# Patient Record
Sex: Female | Born: 1990 | Race: White | Hispanic: No | Marital: Single | Smoking: Former smoker
Health system: Southern US, Community
[De-identification: ages and names within clinical notes are randomized; demographics above are authoritative.]

## PROBLEM LIST (undated history)

## (undated) DIAGNOSIS — S62309A Unspecified fracture of unspecified metacarpal bone, initial encounter for closed fracture: Secondary | ICD-10-CM

## (undated) DIAGNOSIS — Z87442 Personal history of urinary calculi: Secondary | ICD-10-CM

## (undated) DIAGNOSIS — Z862 Personal history of diseases of the blood and blood-forming organs and certain disorders involving the immune mechanism: Secondary | ICD-10-CM

## (undated) HISTORY — PX: NO PAST SURGERIES: SHX2092

---
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
## 2008-01-14 ENCOUNTER — Emergency Department (HOSPITAL_COMMUNITY): Admission: EM | Admit: 2008-01-14 | Discharge: 2008-01-14 | Payer: Self-pay | Admitting: Emergency Medicine

## 2008-01-14 ENCOUNTER — Inpatient Hospital Stay (HOSPITAL_COMMUNITY): Admission: EM | Admit: 2008-01-14 | Discharge: 2008-01-19 | Payer: Self-pay | Admitting: Urology

## 2010-07-07 ENCOUNTER — Emergency Department (HOSPITAL_COMMUNITY): Payer: Self-pay

## 2010-07-07 ENCOUNTER — Emergency Department (HOSPITAL_COMMUNITY)
Admission: EM | Admit: 2010-07-07 | Discharge: 2010-07-07 | Disposition: A | Discharge status: Home / Self Care | Payer: Self-pay | Attending: Emergency Medicine | Admitting: Emergency Medicine

## 2010-07-07 DIAGNOSIS — I889 Nonspecific lymphadenitis, unspecified: Secondary | ICD-10-CM | POA: Insufficient documentation

## 2010-07-07 DIAGNOSIS — R197 Diarrhea, unspecified: Secondary | ICD-10-CM | POA: Insufficient documentation

## 2010-07-07 DIAGNOSIS — Z87442 Personal history of urinary calculi: Secondary | ICD-10-CM | POA: Insufficient documentation

## 2010-07-07 DIAGNOSIS — R109 Unspecified abdominal pain: Secondary | ICD-10-CM | POA: Insufficient documentation

## 2010-07-07 DIAGNOSIS — R112 Nausea with vomiting, unspecified: Secondary | ICD-10-CM | POA: Insufficient documentation

## 2010-07-07 LAB — URINALYSIS, ROUTINE W REFLEX MICROSCOPIC
Ketones, ur: >80 mg/dL — AB
Protein, ur: NEGATIVE mg/dL
Urine Glucose, Fasting: NEGATIVE mg/dL
Urobilinogen, UA: 0.2 mg/dL (ref 0.0–1.0)

## 2010-07-07 LAB — HEPATIC FUNCTION PANEL
ALT: 17 U/L (ref 0–35)
Alkaline Phosphatase: 79 U/L (ref 39–117)
Bilirubin, Direct: 0.2 mg/dL (ref 0.0–0.3)
Indirect Bilirubin: 0.9 mg/dL (ref 0.3–0.9)

## 2010-07-07 LAB — DIFFERENTIAL
Basophils Relative: 0 % (ref 0–1)
Eosinophils Absolute: 0 10*3/uL (ref 0.0–0.7)
Eosinophils Relative: 0 % (ref 0–5)
Lymphs Abs: 0.8 10*3/uL (ref 0.7–4.0)
Monocytes Absolute: 0.7 10*3/uL (ref 0.1–1.0)
Monocytes Relative: 5 % (ref 3–12)
Neutrophils Relative %: 90 % — ABNORMAL HIGH (ref 43–77)

## 2010-07-07 LAB — CBC
MCH: 30.6 pg (ref 26.0–34.0)
MCHC: 36.7 g/dL — ABNORMAL HIGH (ref 30.0–36.0)
MCV: 83.4 fL (ref 78.0–100.0)
Platelets: 202 10*3/uL (ref 150–400)
RBC: 4.64 MIL/uL (ref 3.87–5.11)

## 2010-07-07 LAB — POCT I-STAT, CHEM 8
BUN: 17 mg/dL (ref 6–23)
Calcium, Ion: 1.04 mmol/L — ABNORMAL LOW (ref 1.12–1.32)
Chloride: 106 mEq/L (ref 96–112)
Creatinine, Ser: 0.8 mg/dL (ref 0.4–1.2)
Glucose, Bld: 122 mg/dL — ABNORMAL HIGH (ref 70–99)
TCO2: 20 mmol/L (ref 0–100)

## 2010-07-07 LAB — LIPASE, BLOOD: Lipase: 18 U/L (ref 11–59)

## 2010-07-07 MED ORDER — IOHEXOL 300 MG/ML  SOLN
100.0000 mL | Freq: Once | INTRAMUSCULAR | Status: AC | PRN
  Administered 2010-07-07: 100 mL via INTRAVENOUS

## 2010-10-15 NOTE — Discharge Summary (Signed)
NAMELILLYIAN, HEIDT                ACCOUNT NO.:  [id number]   MEDICAL RECORD NO.:  [id number]          PATIENT TYPE:  INP   LOCATION:  1444                         FACILITY:  [facility]   PHYSICIAN:  Courtney Paris, M.D.DATE OF BIRTH:  [birth year]   DATE OF ADMISSION:  01/14/2008  DATE OF DISCHARGE:  01/19/2008                               DISCHARGE SUMMARY   ADDENDUM:  This 20 year old white female was admitted with left  hydronephrosis and pyelonephritis and probable recent kidney stone  passage.  She had a 103 fever.  I had originally discharged her  yesterday, but after I left, she began vomiting and unable to keep  anything down.  I cancelled the discharge, kept her another day.  She  has not vomited or been nauseated for over 24 hours.  She has been able  to eat and has remained afebrile.  Will send her home on Cipro as  originally planned.      Courtney Paris, M.D.  Electronically Signed     HMK/MEDQ  D:  01/19/2008  T:  01/19/2008  Job:  [id number]

## 2010-10-15 NOTE — H&P (Signed)
Rebecca Crosby, Rebecca Crosby                ACCOUNT NO.:  [id number]   MEDICAL RECORD NO.:  [id number]          PATIENT TYPE:  INP   LOCATION:  1444                         FACILITY:  [facility]   PHYSICIAN:  Ronald L. Earlene Plater, M.D.  DATE OF BIRTH:  [birth year]   DATE OF ADMISSION:  01/14/2008  DATE OF DISCHARGE:                              HISTORY & PHYSICAL   CHIEF COMPLAINT:  I feel terrible.   HISTORY OF PRESENT ILLNESS:  Rebecca Crosby is a very nice 20 year old white  female who presents with left flank pain, fever, chills, nausea.  She  has a known history of kidney stones and has passed multiple stones over  the last few years.  She has undergone workup by Dr. 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  She developed  left flank pain and nausea and was seen in the emergency room today.  CT  scan by report revealed a probable passed left ureteral calculus and  infected urine.  She was given Rocephin, and urine was cultured, and  that is all pending.  She went home and developed fever to 103.1 degrees  Fahrenheit, sweats and chills.  She is admitted for IV fluids,  antibiotics and observation.   PAST MEDICAL HISTORY:  She has no known allergies.   MEDICATIONS:  She is on Phenergan, Vicodin and Azo standard.   ILLNESSES:  None significant except kidney stones.   SOCIAL HISTORY:  Negative smoker, negative drinker.   FAMILY HISTORY:  Nonsignificant.   REVIEW OF SYSTEMS:  She has sweats and chills, urgency, frequency and  some dysuria and flank pain.  No shortness of breath, dyspnea on  exertion, chest pain or GI complaints.   EXAMINATION:  Temperature 103 degrees Fahrenheit.  Vital signs otherwise  stable.  GENERAL:  She is well-nourished, well-developed, well-groomed in mild  distress oriented x3.  HEENT:  Normal.  NECK:  Without mass or thyromegaly.  CHEST:  Has normal diaphragmatic motion.  ABDOMEN:  Soft, nontender without masses, organomegaly or hernia.   She  does have some left CVA and slight suprapubic tenderness.  EXTREMITIES:  Normal.  NEUROLOGICAL:  Intact.  SKIN:  Normal.   IMPRESSION:  Probable past left ureteral calculus with a pyelonephritis  and possible urosepsis.   PLAN:  Admit.  Blood cultures.  IV fluids and IV Cipro.  We will observe  her course.      Ronald L. Earlene Plater, M.D.  Electronically Signed     RLD/MEDQ  D:  01/14/2008  T:  01/15/2008  Job:  [id number]

## 2010-10-15 NOTE — Discharge Summary (Signed)
[name], Rebecca Crosby                ACCOUNT NO.:  [id number]   MEDICAL RECORD NO.:  [id number]          PATIENT TYPE:  INP   LOCATION:  1444                         FACILITY:  [facility]   PHYSICIAN:  Courtney Paris, M.D.DATE OF BIRTH:  [birth year]   DATE OF ADMISSION:  01/14/2008  DATE OF DISCHARGE:  01/18/2008                               DISCHARGE SUMMARY   DISCHARGE DIAGNOSES:  1. Left pyelonephritis.  2. Possible recent passage of kidney stone, no operations or      procedures   BRIEF HISTORY:  This 20 year old white female was admitted as an  emergency on January 14, 2008, by Dr. Earlene Plater.  She has a known history of  kidney stones.  She had left flank pain, fever, chills and nausea and  some vomiting.  She passed multiple stones over the past few years.  She  has undergone extensive metabolic workup in the past.  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  She developed left flank pain  and nausea and was seen in the emergency room on the day of admission.  CT showed some periureteral stranding, a possible past left ureteral  calculus and infected urine.  She was given Rocephin, urine cultured and  she went home.  She developed a fever of 103, came back and was admitted  for IV antibiotics and observation.  She has had no other illnesses  other than kidney stones.  She does not smoke or drink.  She was on  Phenergan, Vicodin and Azo-Standard on admission.   HOSPITAL COURSE:  Her temperature gradually defervesced.  She was put on  Cipro and then later tobramycin.  Her cultures, urine showed no growth  as did her blood cultures.  There were finally negative as well.  Her  temperature gradually defervesced.  A CT scan done on January 17, 2008,  without contrast just to make sure she did not have a stone or other  abnormalities showed no distal stones.  There was a little bit of left  periureteral stranding, but nothing particularly worrisome and no  stones.  Her white  count was normal and her temperature finally  defervesced.  On the fourth hospital day she remained afebrile off the  tobramycin, just on oral Cipro, and I sent her home to continue this for  another week.  She starts school this week and will come back to the  office in 2-3 weeks for follow-up, but will call should she have  recurrent fever, nausea, vomiting or flank pain.  Sent home in improved  ambulatory condition on a regular diet.      Courtney Paris, M.D.  Electronically Signed     HMK/MEDQ  D:  01/18/2008  T:  01/18/2008  Job:  [id number]

## 2011-02-28 LAB — DIFFERENTIAL
Basophils Absolute: 0
Eosinophils Relative: 0
Lymphocytes Relative: 5 — ABNORMAL LOW
Neutro Abs: 9.1 — ABNORMAL HIGH
Neutrophils Relative %: 88 — ABNORMAL HIGH

## 2011-02-28 LAB — URINALYSIS, ROUTINE W REFLEX MICROSCOPIC
Nitrite: POSITIVE — AB
Specific Gravity, Urine: 1.018
pH: 6.5

## 2011-02-28 LAB — BASIC METABOLIC PANEL
Chloride: 102
Creatinine, Ser: 0.94
GFR calc Af Amer: >60
Potassium: 3.5

## 2011-02-28 LAB — URINE CULTURE

## 2011-02-28 LAB — URINE MICROSCOPIC-ADD ON

## 2011-02-28 LAB — CBC
Platelets: 132 — ABNORMAL LOW
RDW: 12.2

## 2011-02-28 LAB — PREGNANCY, URINE: Preg Test, Ur: NEGATIVE

## 2012-09-14 ENCOUNTER — Emergency Department (HOSPITAL_COMMUNITY)
Admission: EM | Admit: 2012-09-14 | Discharge: 2012-09-14 | Disposition: A | Discharge status: Home / Self Care | Payer: Self-pay | Attending: Emergency Medicine | Admitting: Emergency Medicine

## 2012-09-14 ENCOUNTER — Encounter (HOSPITAL_COMMUNITY): Payer: Self-pay | Admitting: Emergency Medicine

## 2012-09-14 ENCOUNTER — Emergency Department (HOSPITAL_COMMUNITY): Payer: Self-pay

## 2012-09-14 DIAGNOSIS — Y929 Unspecified place or not applicable: Secondary | ICD-10-CM | POA: Insufficient documentation

## 2012-09-14 DIAGNOSIS — S6990XA Unspecified injury of unspecified wrist, hand and finger(s), initial encounter: Secondary | ICD-10-CM | POA: Insufficient documentation

## 2012-09-14 DIAGNOSIS — Y939 Activity, unspecified: Secondary | ICD-10-CM | POA: Insufficient documentation

## 2012-09-14 DIAGNOSIS — S59909A Unspecified injury of unspecified elbow, initial encounter: Secondary | ICD-10-CM | POA: Insufficient documentation

## 2012-09-14 DIAGNOSIS — S4980XA Other specified injuries of shoulder and upper arm, unspecified arm, initial encounter: Secondary | ICD-10-CM | POA: Insufficient documentation

## 2012-09-14 DIAGNOSIS — F17200 Nicotine dependence, unspecified, uncomplicated: Secondary | ICD-10-CM | POA: Insufficient documentation

## 2012-09-14 DIAGNOSIS — S46909A Unspecified injury of unspecified muscle, fascia and tendon at shoulder and upper arm level, unspecified arm, initial encounter: Secondary | ICD-10-CM | POA: Insufficient documentation

## 2012-09-14 DIAGNOSIS — S4991XA Unspecified injury of right shoulder and upper arm, initial encounter: Secondary | ICD-10-CM

## 2012-09-14 DIAGNOSIS — W1809XA Striking against other object with subsequent fall, initial encounter: Secondary | ICD-10-CM | POA: Insufficient documentation

## 2012-09-14 MED ORDER — OXYCODONE-ACETAMINOPHEN 5-325 MG PO TABS: 2.0000 | ORAL_TABLET | ORAL | Status: DC | PRN

## 2012-09-14 MED ORDER — CYCLOBENZAPRINE HCL 10 MG PO TABS: 10.0000 mg | ORAL_TABLET | Freq: Two times a day (BID) | ORAL | Status: DC | PRN

## 2012-09-14 NOTE — ED Provider Notes (Signed)
History    This chart was scribed for non-physician practitioner Francee Piccolo, PA-C working with Laray Anger, DO by Gerlean Ren, ED Scribe. This patient was seen in room TR09C/TR09C and the patient's care was started at 8:59 PM.    CSN: [id number]  Arrival date & time 09/14/12  4098   First MD Initiated Contact with Patient 09/14/12 1950      Chief Complaint  Patient presents with  . Shoulder Pain     The history is provided by the patient. No language interpreter was used.  Rebecca Crosby is a 22 y.o. female who presents to the Emergency Department complaining of aching pain localized over the right clavicle with radiation to mid forearm that began when pt fell against her car causing trauma directly to the clavicle.  Pain worsened with right arm use and tender to touch.  Pt denies numbness, weakness or tingling in right arm.  Pt used ibuprofen yesterday with no improvements to pain.  No prior injuries to right clavicle.  Pt denies chest pain, SOB, dyspnea, nausea, emesis, abdominal pain.     History reviewed. No pertinent past medical history.  History reviewed. No pertinent past surgical history.  No family history on file.  History  Substance Use Topics  . Smoking status: Current Every Day Smoker  . Smokeless tobacco: Not on file  . Alcohol Use: Yes    No OB history provided.   Review of Systems  Respiratory: Negative for shortness of breath.   Cardiovascular: Negative for chest pain.  Gastrointestinal: Negative for nausea, vomiting and abdominal pain.  Musculoskeletal:       Positive right clavicle pain    Allergies  Review of patient's allergies indicates no known allergies.  Home Medications   Current Outpatient Rx  Name  Route  Sig  Dispense  Refill  . ibuprofen (ADVIL,MOTRIN) 200 MG tablet   Oral   Take 800 mg by mouth every 6 (six) hours as needed for pain.           BP 125/79  Pulse 63  Temp(Src) 97.9 F (36.6 C) (Oral)  Resp 14   SpO2 100%  LMP 08/30/2012  Physical Exam  Nursing note and vitals reviewed. Constitutional: She is oriented to person, place, and time. She appears well-developed and well-nourished. No distress.  HENT:  Head: Normocephalic and atraumatic.  Eyes: EOM are normal.  Neck: Neck supple. No tracheal deviation present.  Cardiovascular: Normal rate.   Pulmonary/Chest: Effort normal. No respiratory distress.  Musculoskeletal: Normal range of motion.  Right clavicle with contusion, no deformities  Neurological: She is alert and oriented to person, place, and time.  no sensory deficits, strength 5/5 right upper extremity  Skin: Skin is warm and dry.  Psychiatric: She has a normal mood and affect. Her behavior is normal.    ED Course  Procedures (including critical care time) DIAGNOSTIC STUDIES: Oxygen Saturation is 100% on room air, normal by my interpretation.    COORDINATION OF CARE: 9:07 PM- Informed pt that XR is negative for fracture or AC joint separation.  Discussed muscle relaxers, pain medicine, anti-inflammatories, and RICE pain management.  Advised follow-up with orthopedics if pain persists.  Pt verbalizes understanding and agrees with plan.     Dg Clavicle Right  09/14/2012  *RADIOLOGY REPORT*  Clinical Data: Right clavicle pain secondary to a fall 3 days ago.  RIGHT CLAVICLE - 2+ VIEWS  Comparison: None.  Findings: The clavicle is normal.  No fracture.  [facility]  joint appears normal.  IMPRESSION: Normal exam.   Original Report Authenticated By: Francene Boyers, M.D.      1. Injury of clavicle, right, initial encounter       MDM  PE shows no instability, tenderness, or deformity of acromioclavicular and sternoclavicular joints, the cervical spine, glenohumeral joint, coracoid process, acromion, or scapula. Good shoulder strength during empty can test. Good ROM during scratch test. No signs of impingement on Neers test. No shoulder instability during Apprehension test. Provided  pain medications and sling. Advised about adhesive capsulitis. Given follow up with PCP and ortho. Patient agreeable to plan. Patient is stable at time of discharge     I personally performed the services described in this documentation, which was scribed in my presence. The recorded information has been reviewed and is accurate.      Lise Auer Chalonda Schlatter, PA-C 09/15/12 0025

## 2012-09-14 NOTE — ED Notes (Signed)
Patient transported to X-ray 

## 2012-09-14 NOTE — ED Notes (Signed)
PT. TRIPPED AND FELL 2 DAYS AGO HIT HER RIGHT COLLAR BONE AGAINST SIDE MIRROR OF CAR PAIN WORSE WITH MOVEMENT / PALPATION.

## 2012-09-14 NOTE — Progress Notes (Signed)
Orthopedic Tech Progress Note Patient Details:  Rebecca Crosby 06/05/1990 [id number]  Ortho Devices Type of Ortho Device: Arm sling Ortho Device/Splint Location: RUE Ortho Device/Splint Interventions: Ordered;Application   Jennye Moccasin 09/14/2012, 9:29 PM

## 2012-09-16 NOTE — ED Provider Notes (Signed)
Medical screening examination/treatment/procedure(s) were performed by non-physician practitioner and as supervising physician I was immediately available for consultation/collaboration.   Tyreece Gelles M Nicolae Vasek, DO 09/16/12 1447 

## 2013-12-03 DIAGNOSIS — S62309A Unspecified fracture of unspecified metacarpal bone, initial encounter for closed fracture: Secondary | ICD-10-CM

## 2013-12-03 HISTORY — DX: Unspecified fracture of unspecified metacarpal bone, initial encounter for closed fracture: S62.309A

## 2013-12-04 ENCOUNTER — Encounter (HOSPITAL_COMMUNITY): Payer: Self-pay | Admitting: Emergency Medicine

## 2013-12-04 ENCOUNTER — Emergency Department (HOSPITAL_COMMUNITY): Payer: Self-pay

## 2013-12-04 ENCOUNTER — Emergency Department (HOSPITAL_COMMUNITY)
Admission: EM | Admit: 2013-12-04 | Discharge: 2013-12-04 | Disposition: A | Discharge status: Home / Self Care | Payer: Self-pay | Attending: Emergency Medicine | Admitting: Emergency Medicine

## 2013-12-04 DIAGNOSIS — W010XXA Fall on same level from slipping, tripping and stumbling without subsequent striking against object, initial encounter: Secondary | ICD-10-CM | POA: Insufficient documentation

## 2013-12-04 DIAGNOSIS — M79641 Pain in right hand: Secondary | ICD-10-CM

## 2013-12-04 DIAGNOSIS — Y929 Unspecified place or not applicable: Secondary | ICD-10-CM | POA: Insufficient documentation

## 2013-12-04 DIAGNOSIS — F17200 Nicotine dependence, unspecified, uncomplicated: Secondary | ICD-10-CM | POA: Insufficient documentation

## 2013-12-04 DIAGNOSIS — Y9389 Activity, other specified: Secondary | ICD-10-CM | POA: Insufficient documentation

## 2013-12-04 DIAGNOSIS — Z79899 Other long term (current) drug therapy: Secondary | ICD-10-CM | POA: Insufficient documentation

## 2013-12-04 DIAGNOSIS — S62329A Displaced fracture of shaft of unspecified metacarpal bone, initial encounter for closed fracture: Secondary | ICD-10-CM | POA: Insufficient documentation

## 2013-12-04 DIAGNOSIS — S62308A Unspecified fracture of other metacarpal bone, initial encounter for closed fracture: Secondary | ICD-10-CM

## 2013-12-04 DIAGNOSIS — W1809XA Striking against other object with subsequent fall, initial encounter: Secondary | ICD-10-CM | POA: Insufficient documentation

## 2013-12-04 MED ORDER — OXYCODONE-ACETAMINOPHEN 5-325 MG PO TABS: 1.0000 | ORAL_TABLET | Freq: Four times a day (QID) | ORAL | Status: DC | PRN

## 2013-12-04 MED ORDER — OXYCODONE-ACETAMINOPHEN 5-325 MG PO TABS
2.0000 | ORAL_TABLET | Freq: Once | ORAL | Status: AC
  Administered 2013-12-04: 2 via ORAL
  Filled 2013-12-04: qty 2

## 2013-12-04 NOTE — Progress Notes (Signed)
Orthopedic Tech Progress Note Patient Details:  Edwena BlowMollie Bloodworth 08/09/1990 [id number]  Ortho Devices Type of Ortho Device: Ulna gutter splint Ortho Device/Splint Location: rue Ortho Device/Splint Interventions: Application   Tayven Renteria 12/04/2013, 10:22 PM

## 2013-12-04 NOTE — ED Provider Notes (Signed)
CSN: [id number]     Arrival date & time 12/04/13  1958 History   First MD Initiated Contact with Patient 12/04/13 2130     Chief Complaint  Patient presents with  . Hand Injury    (Consider location/radiation/quality/duration/timing/severity/associated sxs/prior Treatment) Patient is a 23 y.o. female presenting with hand injury. The history is provided by the patient. No language interpreter was used.  Hand Injury Location:  Hand Time since incident:  1 day Injury: yes   Mechanism of injury comment:  Patient states that she tripped and fell, reaching her right hand out in front of her to break her fall causing her injuries Hand location:  R hand Pain details:    Quality:  Aching and sharp   Radiates to:  Does not radiate   Severity:  Moderate   Onset quality:  Sudden   Duration:  1 day   Timing:  Constant   Progression:  Worsening Chronicity:  New Handedness:  Left-handed Dislocation: no   Prior injury to area:  No Relieved by:  Nothing Worsened by:  Movement Ineffective treatments:  NSAIDs Associated symptoms: swelling   Associated symptoms: no decreased range of motion, no muscle weakness, no numbness and no tingling     History reviewed. No pertinent past medical history. History reviewed. No pertinent past surgical history. No family history on file. History  Substance Use Topics  . Smoking status: Current Every Day Smoker  . Smokeless tobacco: Not on file  . Alcohol Use: Yes   OB History   Grav Para Term Preterm Abortions TAB SAB Ect Mult Living                  Review of Systems  Musculoskeletal: Positive for arthralgias and myalgias.  Skin: Negative for pallor.  Neurological: Negative for weakness and numbness.  All other systems reviewed and are negative.    Allergies  Review of patient's allergies indicates no known allergies.  Home Medications   Prior to Admission medications   Medication Sig Start Date End Date Taking? Authorizing Provider    cyclobenzaprine (FLEXERIL) 10 MG tablet Take 1 tablet (10 mg total) by mouth 2 (two) times daily as needed for muscle spasms. 09/14/12   Jennifer L Piepenbrink, PA-C  ibuprofen (ADVIL,MOTRIN) 200 MG tablet Take 800 mg by mouth every 6 (six) hours as needed for pain.    Historical Provider, MD  oxyCODONE-acetaminophen (PERCOCET/ROXICET) 5-325 MG per tablet Take 1-2 tablets by mouth every 6 (six) hours as needed. 12/04/13   Antony MaduraKelly Meygan Kyser, PA-C   BP 97/59  Pulse 65  Temp(Src) 98 F (36.7 C) (Oral)  Resp 18  Ht 5\' 3"  (1.6 m)  Wt 99 lb 7 oz (45.105 kg)  BMI 17.62 kg/m2  SpO2 97%  LMP 12/04/2013  Physical Exam  Nursing note and vitals reviewed. Constitutional: She is oriented to person, place, and time. She appears well-developed and well-nourished. No distress.  HENT:  Head: Normocephalic and atraumatic.  Eyes: Conjunctivae and EOM are normal. No scleral icterus.  Neck: Normal range of motion.  Cardiovascular: Normal rate, regular rhythm and intact distal pulses.   Distal radial pulse 2+ in right upper extremity. Capillary refill normal in all digits of right hand  Pulmonary/Chest: Effort normal. No respiratory distress.  Musculoskeletal:       Right hand: She exhibits tenderness, bony tenderness and swelling. She exhibits normal range of motion, normal two-point discrimination, normal capillary refill and no deformity. Normal sensation noted.  Hands: Neurological: She is alert and oriented to person, place, and time. She exhibits normal muscle tone.  No gross sensory deficits appreciated. Patient able to wiggle all fingers.  Skin: Skin is warm and dry. No rash noted. She is not diaphoretic. No erythema. No pallor.  Psychiatric: She has a normal mood and affect. Her behavior is normal.    ED Course  Procedures (including critical care time) Labs Review Labs Reviewed - No data to display  Imaging Review Dg Hand Complete Right  12/04/2013   CLINICAL DATA:  Right hand pain in the  region of the ring and little fingers following a fall.  EXAM: RIGHT HAND - COMPLETE 3+ VIEW  COMPARISON:  Right wrist dated 03/15/2006.  FINDINGS: Comminuted fracture of the fourth metacarpal shaft with 1/6 shaft width of ulnar displacement and 1/5 shaft width of dorsal displacement of the distal fragment without significant angulation. No other fractures are seen.  IMPRESSION: Fourth metacarpal fracture, as described above.   Electronically Signed   By: Gordan PaymentSteve  Reid M.D.   On: 12/04/2013 21:08     EKG Interpretation None      MDM   Final diagnoses:  Fracture of fourth metacarpal bone, closed, initial encounter  Right hand pain    23 year old left-handed female presents to the emergency department for right hand pain from a mechanical fall yesterday. Patient neurovascularly intact. No gross sensory deficits appreciated. Patient able to wiggle all fingers. No evidence of open fracture. X-ray today shows fractures of the fourth metacarpal shaft with displacement. Patient placed in ulnar gutter splint for stability. She will be referred to hand specialist for followup. Return precautions discussed and provided. Patient agreeable to plan with no unaddressed concerns.   Filed Vitals:   12/04/13 2010 12/04/13 2033 12/04/13 2045 12/04/13 2055  BP: 100/58 106/66 97/59   Pulse: 79 82  65  Temp: 98 F (36.7 C)     TempSrc: Oral     Resp: 16 18    Height: 5\' 3"  (1.6 m)     Weight: 99 lb 7 oz (45.105 kg)     SpO2: 98% 100%  97%       Antony MaduraKelly Masiah Lewing, PA-C 12/04/13 2206

## 2013-12-04 NOTE — ED Notes (Signed)
The pt is c/o rt hand pain where she fell last pm and struck her hand on an object.  lmp  now

## 2013-12-04 NOTE — Discharge Instructions (Signed)
Hand Fracture, Metacarpals °Fractures of metacarpals are breaks in the bones of the hand. They extend from the knuckles to the wrist. These bones can undergo many types of fractures. There are different ways of treating these fractures, all of which may be correct. °TREATMENT  °Hand fractures can be treated with:  °· Non-reduction - The fracture is casted without changing the positions of the fracture (bone pieces) involved. This fracture is usually left in a cast for 4 to 6 weeks or as your caregiver thinks necessary. °· Closed reduction - The bones are moved back into position without surgery and then casted. °· ORIF (open reduction and internal fixation) - The fracture site is opened and the bone pieces are fixed into place with some type of hardware, such as screws, etc. They are then casted. °Your caregiver will discuss the type of fracture you have and the treatment that should be best for that problem. If surgery is chosen, let your caregivers know about the following.  °LET YOUR CAREGIVERS KNOW ABOUT: °· Allergies. °· Medications you are taking, including herbs, eye drops, over the counter medications, and creams. °· Use of steroids (by mouth or creams). °· Previous problems with anesthetics or novocaine. °· Possibility of pregnancy. °· History of blood clots (thrombophlebitis). °· History of bleeding or blood problems. °· Previous surgeries. °· Other health problems. °AFTER THE PROCEDURE °After surgery, you will be taken to the recovery area where a nurse will watch and check your progress. Once you are awake, stable, and taking fluids well, barring other problems, you'll be allowed to go home. Once home, an ice pack applied to your operative site may help with pain and keep the swelling down. °HOME CARE INSTRUCTIONS  °· Follow your caregiver's instructions as to activities, exercises, physical therapy, and driving a car. °· Daily exercise is helpful for keeping range of motion and strength. Exercise as  instructed. °· To lessen swelling, keep the injured hand elevated above the level of your heart as much as possible. °· Apply ice to the injury for 15-20 minutes each hour while awake for the first 2 days. Put the ice in a plastic bag and place a thin towel between the bag of ice and your cast. °· Move the fingers of your casted hand several times a day. °· If a plaster or fiberglass cast was applied: °¨ Do not try to scratch the skin under the cast using a sharp or pointed object. °¨ Check the skin around the cast every day. You may put lotion on red or sore areas. °¨ Keep your cast dry. Your cast can be protected during bathing with a plastic bag. Do not put your cast into the water. °· If a plaster splint was applied: °¨ Wear your splint for as long as directed by your caregiver or until seen again. °¨ Do not get your splint wet. Protect it during bathing with a plastic bag. °¨ You may loosen the elastic bandage around the splint if your fingers start to get numb, tingle, get cold or turn blue. °· Do not put pressure on your cast or splint; this may cause it to break. Especially, do not lean plaster casts on hard surfaces for 24 hours after application. °· Take medications as directed by your caregiver. °· Only take over-the-counter or prescription medicines for pain, discomfort, or fever as directed by your caregiver. °· Follow-up as provided by your caregiver. This is very important in order to avoid permanent injury or disability and chronic   pain. °SEEK MEDICAL CARE IF:  °· Increased bleeding (more than a small spot) from beneath your cast or splint if there is beneath the cast as with an open reduction. °· Redness, swelling, or increasing pain in the wound or from beneath your cast or splint. °· Pus coming from wound or from beneath your cast or splint. °· An unexplained oral temperature above 102° F (38.9° C) develops, or as your caregiver suggests. °· A foul smell coming from the wound or dressing or from  beneath your cast or splint. °· You have a problem moving any of your fingers. °SEEK IMMEDIATE MEDICAL CARE IF:  °· You develop a rash °· You have difficulty breathing °· You have any allergy problems °If you do not have a window in your cast for observing the wound, a discharge or minor bleeding may show up as a stain on the outside of your cast. Report these findings to your caregiver. °MAKE SURE YOU:  °· Understand these instructions. °· Will watch your condition. °· Will get help right away if you are not doing well or get worse. °Document Released: 05/19/2005 Document Revised: 08/11/2011 Document Reviewed: 01/06/2008 °[employer]® Patient Information ©2015 [employer]. This information is not intended to replace advice given to you by your health care provider. Make sure you discuss any questions you have with your health care provider. ° °

## 2013-12-04 NOTE — ED Notes (Signed)
Patient transported to X-ray 

## 2013-12-04 NOTE — ED Notes (Signed)
Ortho at bedside.

## 2013-12-04 NOTE — ED Notes (Signed)
Patient declines wheelchair at discharge.  Patient escorted to lobby by RN.  

## 2013-12-05 NOTE — ED Provider Notes (Signed)
Medical screening examination/treatment/procedure(s) were performed by non-physician practitioner and as supervising physician I was immediately available for consultation/collaboration.   EKG Interpretation None        Audree CamelScott T Karey Suthers, MD 12/05/13 1214

## 2013-12-09 ENCOUNTER — Encounter (HOSPITAL_BASED_OUTPATIENT_CLINIC_OR_DEPARTMENT_OTHER): Payer: Self-pay | Admitting: *Deleted

## 2013-12-09 ENCOUNTER — Other Ambulatory Visit: Payer: Self-pay | Admitting: Orthopedic Surgery

## 2013-12-12 ENCOUNTER — Encounter (HOSPITAL_BASED_OUTPATIENT_CLINIC_OR_DEPARTMENT_OTHER)
Admission: RE | Disposition: A | Discharge status: Home / Self Care | Payer: Self-pay | Source: Ambulatory Visit | Attending: Orthopedic Surgery

## 2013-12-12 ENCOUNTER — Encounter (HOSPITAL_BASED_OUTPATIENT_CLINIC_OR_DEPARTMENT_OTHER): Payer: Self-pay | Admitting: Anesthesiology

## 2013-12-12 ENCOUNTER — Ambulatory Visit (HOSPITAL_BASED_OUTPATIENT_CLINIC_OR_DEPARTMENT_OTHER): Payer: Self-pay | Admitting: Anesthesiology

## 2013-12-12 ENCOUNTER — Ambulatory Visit (HOSPITAL_BASED_OUTPATIENT_CLINIC_OR_DEPARTMENT_OTHER)
Admission: RE | Admit: 2013-12-12 | Discharge: 2013-12-12 | Disposition: A | Discharge status: Home / Self Care | Payer: Self-pay | Source: Ambulatory Visit | Attending: Orthopedic Surgery | Admitting: Orthopedic Surgery

## 2013-12-12 ENCOUNTER — Encounter (HOSPITAL_BASED_OUTPATIENT_CLINIC_OR_DEPARTMENT_OTHER): Payer: Self-pay

## 2013-12-12 DIAGNOSIS — Y9389 Activity, other specified: Secondary | ICD-10-CM | POA: Insufficient documentation

## 2013-12-12 DIAGNOSIS — F17200 Nicotine dependence, unspecified, uncomplicated: Secondary | ICD-10-CM | POA: Insufficient documentation

## 2013-12-12 DIAGNOSIS — Z862 Personal history of diseases of the blood and blood-forming organs and certain disorders involving the immune mechanism: Secondary | ICD-10-CM | POA: Insufficient documentation

## 2013-12-12 DIAGNOSIS — S62309A Unspecified fracture of unspecified metacarpal bone, initial encounter for closed fracture: Secondary | ICD-10-CM | POA: Insufficient documentation

## 2013-12-12 DIAGNOSIS — Z87442 Personal history of urinary calculi: Secondary | ICD-10-CM | POA: Insufficient documentation

## 2013-12-12 DIAGNOSIS — Z79899 Other long term (current) drug therapy: Secondary | ICD-10-CM | POA: Insufficient documentation

## 2013-12-12 DIAGNOSIS — F1010 Alcohol abuse, uncomplicated: Secondary | ICD-10-CM | POA: Insufficient documentation

## 2013-12-12 DIAGNOSIS — W010XXA Fall on same level from slipping, tripping and stumbling without subsequent striking against object, initial encounter: Secondary | ICD-10-CM | POA: Insufficient documentation

## 2013-12-12 HISTORY — DX: Personal history of urinary calculi: Z87.442

## 2013-12-12 HISTORY — DX: Personal history of diseases of the blood and blood-forming organs and certain disorders involving the immune mechanism: Z86.2

## 2013-12-12 HISTORY — DX: Unspecified fracture of unspecified metacarpal bone, initial encounter for closed fracture: S62.309A

## 2013-12-12 HISTORY — PX: OPEN REDUCTION INTERNAL FIXATION (ORIF) METACARPAL: SHX6234

## 2013-12-12 LAB — POCT HEMOGLOBIN-HEMACUE: Hemoglobin: 16.3 g/dL — ABNORMAL HIGH (ref 12.0–15.0)

## 2013-12-12 SURGERY — OPEN REDUCTION INTERNAL FIXATION (ORIF) METACARPAL
Anesthesia: General | Site: Finger | Laterality: Right

## 2013-12-12 MED ORDER — OXYCODONE HCL 5 MG/5ML PO SOLN: 5.0000 mg | Freq: Once | ORAL | Status: DC | PRN

## 2013-12-12 MED ORDER — CEFAZOLIN SODIUM-DEXTROSE 2-3 GM-% IV SOLR
INTRAVENOUS | Status: AC
  Filled 2013-12-12: qty 50

## 2013-12-12 MED ORDER — BUPIVACAINE-EPINEPHRINE (PF) 0.5% -1:200000 IJ SOLN
INTRAMUSCULAR | Status: DC | PRN
  Administered 2013-12-12: 22 mL via PERINEURAL

## 2013-12-12 MED ORDER — ONDANSETRON HCL 4 MG/2ML IJ SOLN: 4.0000 mg | Freq: Once | INTRAMUSCULAR | Status: DC | PRN

## 2013-12-12 MED ORDER — BUPIVACAINE HCL (PF) 0.25 % IJ SOLN
INTRAMUSCULAR | Status: AC
  Filled 2013-12-12: qty 30

## 2013-12-12 MED ORDER — MIDAZOLAM HCL 2 MG/2ML IJ SOLN
INTRAMUSCULAR | Status: AC
  Filled 2013-12-12: qty 2

## 2013-12-12 MED ORDER — LACTATED RINGERS IV SOLN
INTRAVENOUS | Status: DC
  Administered 2013-12-12: 11:00:00 via INTRAVENOUS

## 2013-12-12 MED ORDER — OXYCODONE-ACETAMINOPHEN 5-325 MG PO TABS: ORAL_TABLET | ORAL | Status: DC

## 2013-12-12 MED ORDER — ONDANSETRON HCL 4 MG/2ML IJ SOLN
INTRAMUSCULAR | Status: DC | PRN
  Administered 2013-12-12: 4 mg via INTRAVENOUS

## 2013-12-12 MED ORDER — PROPOFOL 10 MG/ML IV BOLUS
INTRAVENOUS | Status: DC | PRN
  Administered 2013-12-12: 200 mg via INTRAVENOUS

## 2013-12-12 MED ORDER — CHLORHEXIDINE GLUCONATE 4 % EX LIQD: 60.0000 mL | Freq: Once | CUTANEOUS | Status: DC

## 2013-12-12 MED ORDER — PROPOFOL 10 MG/ML IV EMUL
INTRAVENOUS | Status: AC
  Filled 2013-12-12: qty 50

## 2013-12-12 MED ORDER — OXYCODONE HCL 5 MG PO TABS: 5.0000 mg | ORAL_TABLET | Freq: Once | ORAL | Status: DC | PRN

## 2013-12-12 MED ORDER — FENTANYL CITRATE 0.05 MG/ML IJ SOLN
50.0000 ug | INTRAMUSCULAR | Status: DC | PRN
  Administered 2013-12-12: 100 ug via INTRAVENOUS

## 2013-12-12 MED ORDER — MIDAZOLAM HCL 2 MG/2ML IJ SOLN
1.0000 mg | INTRAMUSCULAR | Status: DC | PRN
  Administered 2013-12-12: 2 mg via INTRAVENOUS

## 2013-12-12 MED ORDER — FENTANYL CITRATE 0.05 MG/ML IJ SOLN
INTRAMUSCULAR | Status: AC
  Filled 2013-12-12: qty 4

## 2013-12-12 MED ORDER — CEFAZOLIN SODIUM-DEXTROSE 2-3 GM-% IV SOLR
2.0000 g | INTRAVENOUS | Status: AC
  Administered 2013-12-12: 2 g via INTRAVENOUS

## 2013-12-12 MED ORDER — FENTANYL CITRATE 0.05 MG/ML IJ SOLN
INTRAMUSCULAR | Status: DC | PRN
  Administered 2013-12-12: 25 ug via INTRAVENOUS

## 2013-12-12 MED ORDER — LIDOCAINE HCL (CARDIAC) 20 MG/ML IV SOLN
INTRAVENOUS | Status: DC | PRN
  Administered 2013-12-12: 75 mg via INTRAVENOUS

## 2013-12-12 MED ORDER — FENTANYL CITRATE 0.05 MG/ML IJ SOLN
INTRAMUSCULAR | Status: AC
  Filled 2013-12-12: qty 2

## 2013-12-12 MED ORDER — MIDAZOLAM HCL 2 MG/ML PO SYRP
12.0000 mg | ORAL_SOLUTION | Freq: Once | ORAL | Status: DC | PRN
Start: 2013-12-12 — End: 2013-12-12

## 2013-12-12 MED ORDER — HYDROMORPHONE HCL PF 1 MG/ML IJ SOLN
0.2500 mg | INTRAMUSCULAR | Status: DC | PRN
Start: 2013-12-12 — End: 2013-12-12

## 2013-12-12 MED ORDER — DEXAMETHASONE SODIUM PHOSPHATE 4 MG/ML IJ SOLN
INTRAMUSCULAR | Status: DC | PRN
  Administered 2013-12-12: 10 mg via INTRAVENOUS

## 2013-12-12 SURGICAL SUPPLY — 67 items
BANDAGE ELASTIC 3 VELCRO ST LF (GAUZE/BANDAGES/DRESSINGS) IMPLANT
BIT DRILL 1.1 (BIT) ×2
BIT DRILL 1.1MM (BIT) ×1
BIT DRILL 60X20X1.1XQC TMX (BIT) IMPLANT
BIT DRL 60X20X1.1XQC TMX (BIT) ×1
BLADE MINI RND TIP GREEN BEAV (BLADE) IMPLANT
BLADE SURG 15 STRL LF DISP TIS (BLADE) ×2 IMPLANT
BLADE SURG 15 STRL SS (BLADE) ×6
BNDG CMPR 9X4 STRL LF SNTH (GAUZE/BANDAGES/DRESSINGS) ×1
BNDG ESMARK 4X9 LF (GAUZE/BANDAGES/DRESSINGS) ×3 IMPLANT
BNDG GAUZE ELAST 4 BULKY (GAUZE/BANDAGES/DRESSINGS) ×3 IMPLANT
CHLORAPREP W/TINT 26ML (MISCELLANEOUS) ×3 IMPLANT
CORDS BIPOLAR (ELECTRODE) ×3 IMPLANT
COVER MAYO STAND STRL (DRAPES) ×3 IMPLANT
COVER TABLE BACK 60X90 (DRAPES) ×3 IMPLANT
CUFF TOURNIQUET SINGLE 18IN (TOURNIQUET CUFF) ×3 IMPLANT
DRAPE EXTREMITY T 121X128X90 (DRAPE) ×3 IMPLANT
DRAPE OEC MINIVIEW 54X84 (DRAPES) ×3 IMPLANT
DRAPE SURG 17X23 STRL (DRAPES) ×3 IMPLANT
DRIVER BIT 1.5 (TRAUMA) ×2 IMPLANT
GAUZE SPONGE 4X4 12PLY STRL (GAUZE/BANDAGES/DRESSINGS) ×3 IMPLANT
GAUZE XEROFORM 1X8 LF (GAUZE/BANDAGES/DRESSINGS) ×1 IMPLANT
GLOVE BIO SURGEON STRL SZ7.5 (GLOVE) ×3 IMPLANT
GLOVE BIOGEL PI IND STRL 8 (GLOVE) ×1 IMPLANT
GLOVE BIOGEL PI IND STRL 8.5 (GLOVE) IMPLANT
GLOVE BIOGEL PI INDICATOR 8 (GLOVE) ×2
GLOVE BIOGEL PI INDICATOR 8.5 (GLOVE)
GLOVE SURG ORTHO 8.0 STRL STRW (GLOVE) IMPLANT
GOWN STRL REUS W/ TWL LRG LVL3 (GOWN DISPOSABLE) ×1 IMPLANT
GOWN STRL REUS W/ TWL XL LVL3 (GOWN DISPOSABLE) ×1 IMPLANT
GOWN STRL REUS W/TWL LRG LVL3 (GOWN DISPOSABLE) ×3
GOWN STRL REUS W/TWL XL LVL3 (GOWN DISPOSABLE) ×3 IMPLANT
NDL HYPO 25X1 1.5 SAFETY (NEEDLE) IMPLANT
NEEDLE HYPO 22GX1.5 SAFETY (NEEDLE) IMPLANT
NEEDLE HYPO 25X1 1.5 SAFETY (NEEDLE) ×3 IMPLANT
NS IRRIG 1000ML POUR BTL (IV SOLUTION) ×3 IMPLANT
PACK BASIN DAY SURGERY FS (CUSTOM PROCEDURE TRAY) ×3 IMPLANT
PAD CAST 3X4 CTTN HI CHSV (CAST SUPPLIES) IMPLANT
PAD CAST 4YDX4 CTTN HI CHSV (CAST SUPPLIES) ×1 IMPLANT
PADDING CAST ABS 4INX4YD NS (CAST SUPPLIES)
PADDING CAST ABS COTTON 4X4 ST (CAST SUPPLIES) IMPLANT
PADDING CAST COTTON 3X4 STRL (CAST SUPPLIES)
PADDING CAST COTTON 4X4 STRL (CAST SUPPLIES) ×3
PLATE STRAIGHT LOCK 1.5 (Plate) ×2 IMPLANT
SCREW L 1.5X12 (Screw) ×2 IMPLANT
SCREW NL 1.5X11 WRIST (Screw) ×2 IMPLANT
SCREW NL 1.5X12 (Screw) ×2 IMPLANT
SCREW NONIOC 1.5 10M (Screw) ×12 IMPLANT
SCREW NONIOC 1.5 14M (Screw) ×3 IMPLANT
SLEEVE SCD COMPRESS KNEE MED (MISCELLANEOUS) IMPLANT
SPLINT PLASTER CAST XFAST 3X15 (CAST SUPPLIES) IMPLANT
SPLINT PLASTER CAST XFAST 4X15 (CAST SUPPLIES) IMPLANT
SPLINT PLASTER XTRA FAST SET 4 (CAST SUPPLIES)
SPLINT PLASTER XTRA FASTSET 3X (CAST SUPPLIES)
STOCKINETTE 4X48 STRL (DRAPES) ×3 IMPLANT
SUT ETHILON 3 0 PS 1 (SUTURE) IMPLANT
SUT ETHILON 4 0 PS 2 18 (SUTURE) ×3 IMPLANT
SUT MERSILENE 4 0 P 3 (SUTURE) IMPLANT
SUT MON AB 5-0 PS2 18 (SUTURE) ×3 IMPLANT
SUT VIC AB 3-0 PS1 18 (SUTURE)
SUT VIC AB 3-0 PS1 18XBRD (SUTURE) IMPLANT
SUT VICRYL 4-0 PS2 18IN ABS (SUTURE) ×3 IMPLANT
SYR BULB 3OZ (MISCELLANEOUS) ×3 IMPLANT
SYRINGE CONTROL L 12CC (SYRINGE) ×3 IMPLANT
SYRINGE CONTROL LL 12CC (SYRINGE) IMPLANT
TOWEL OR 17X24 6PK STRL BLUE (TOWEL DISPOSABLE) ×4 IMPLANT
UNDERPAD 30X30 INCONTINENT (UNDERPADS AND DIAPERS) ×3 IMPLANT

## 2013-12-12 NOTE — Discharge Instructions (Addendum)
[facility] Instructions °Hand Surgery ° °Wound Care: °Keep your hand elevated above the level of your heart.  Do not allow it to dangle by your side.  Keep the dressing dry and do not remove it unless your doctor advises you to do so.  He will usually change it at the time of your post-op visit.  Moving your fingers is advised to stimulate circulation but will depend on the site of your surgery.  If you have a splint applied, your doctor will advise you regarding movement. ° °Activity: °Do not drive or operate machinery today.  Rest today and then you may return to your normal activity and work as indicated by your physician. ° °Diet:  °Drink liquids today or eat a light diet.  You may resume a regular diet tomorrow.   ° °General expectations: °Pain for two to three days. °Fingers may become slightly swollen. ° °Call your doctor if any of the following occur: °Severe pain not relieved by pain medication. °Elevated temperature. °Dressing soaked with blood. °Inability to move fingers. °White or bluish color to fingers. ° ° °Regional Anesthesia Blocks ° °1. Numbness or the inability to move the "blocked" extremity may last from 3-48 hours after placement. The length of time depends on the medication injected and your individual response to the medication. If the numbness is not going away after 48 hours, call your surgeon. ° °2. The extremity that is blocked will need to be protected until the numbness is gone and the  Strength has returned. Because you cannot feel it, you will need to take extra care to avoid injury. Because it may be weak, you may have difficulty moving it or using it. You may not know what position it is in without looking at it while the block is in effect. ° °3. For blocks in the legs and feet, returning to weight bearing and walking needs to be done carefully. You will need to wait until the numbness is entirely gone and the strength has returned. You should be able to move your leg and foot  normally before you try and bear weight or walk. You will need someone to be with you when you first try to ensure you do not fall and possibly risk injury. ° °4. Bruising and tenderness at the needle site are common side effects and will resolve in a few days. ° °5. Persistent numbness or new problems with movement should be communicated to the surgeon or the [facility] ([phone])/ [facility] ([phone]). ° ° °Post Anesthesia Home Care Instructions ° °Activity: °Get plenty of rest for the remainder of the day. A responsible adult should stay with you for 24 hours following the procedure.  °For the next 24 hours, DO NOT: °-Drive a car °-Operate machinery °-Drink alcoholic beverages °-Take any medication unless instructed by your physician °-Make any legal decisions or sign important papers. ° °Meals: °Start with liquid foods such as gelatin or soup. Progress to regular foods as tolerated. Avoid greasy, spicy, heavy foods. If nausea and/or vomiting occur, drink only clear liquids until the nausea and/or vomiting subsides. Call your physician if vomiting continues. ° °Special Instructions/Symptoms: °Your throat may feel dry or sore from the anesthesia or the breathing tube placed in your throat during surgery. If this causes discomfort, gargle with warm salt water. The discomfort should disappear within 24 hours. ° °

## 2013-12-12 NOTE — H&P (Signed)
  Rebecca Crosby is an 23 y.o. female.   Chief Complaint: right hand metacarpal fracture HPI: 23 yo lhd female states she fell on right hand while camping 12/03/13.  [facility] where XR revealed right ring finger metacarpal fracture.  Splinted and followed up in office.  Reports no previous injury to hand and no other injury at this time.    Past Medical History  Diagnosis Date  . Metacarpal bone fracture 12/03/2013    right ring  . History of anemia 2008  . History of kidney stones     Past Surgical History  Procedure Laterality Date  . No past surgeries      History reviewed. No pertinent family history. Social History:  reports that she has been smoking Cigarettes.  She has been smoking about 0.50 packs per day. She has never used smokeless tobacco. She reports that she drinks alcohol. She reports that she does not use illicit drugs.  Allergies: No Known Allergies  Medications Prior to Admission  Medication Sig Dispense Refill  . Ascorbic Acid (VITAMIN C GUMMIE PO) Take by mouth.      Marland Kitchen. ibuprofen (ADVIL,MOTRIN) 200 MG tablet Take 800 mg by mouth every 6 (six) hours as needed for pain.        No results found for this or any previous visit (from the past 48 hour(s)).  No results found.   A comprehensive review of systems was negative except for: Genitourinary: positive for dysuria and hematuria Hematologic/lymphatic: positive for anemia Behavioral/Psych: positive for anxiety  Height 5\' 3"  (1.6 m), weight 46.72 kg (103 lb), last menstrual period 12/03/2013.  General appearance: alert, cooperative and appears stated age Head: Normocephalic, without obvious abnormality, atraumatic Neck: supple, symmetrical, trachea midline Resp: clear to auscultation bilaterally Cardio: regular rate and rhythm GI: non tender Extremities: intact sensation and capillary refill all digits.  +epl/fpl/io.  no wounds.  when making a fist, ring finger deviates away from long finger.  ttp ring  finger metacarpal Pulses: 2+ and symmetric Skin: Skin color, texture, turgor normal. No rashes or lesions Neurologic: Grossly normal Incision/Wound: none  Assessment/Plan Right ring finger metacarpal fracture with rotation and shortening.  Non operative and operative treatment options were discussed with the patient and patient wishes to proceed with operative treatment. Risks, benefits, and alternatives of surgery were discussed and the patient agrees with the plan of care.   Rebecca Crosby R 12/12/2013, 9:58 AM

## 2013-12-12 NOTE — Anesthesia Postprocedure Evaluation (Signed)
  Anesthesia Post-op Note  Patient: Rebecca Crosby  Procedure(s) Performed: Procedure(s): OPEN REDUCTION INTERNAL FIXATION (ORIF) RIGHT RING FINGER METACARPAL (Right)  Patient Location: PACU  Anesthesia Type: General with regional for post op pain control   Level of Consciousness: awake, alert  and oriented  Airway and Oxygen Therapy: Patient Spontanous Breathing  Post-op Pain: none  Post-op Assessment: Post-op Vital signs reviewed  Post-op Vital Signs: Reviewed  Last Vitals:  Filed Vitals:   12/12/13 1300  BP: 104/67  Pulse: 73  Temp:   Resp: 18    Complications: No apparent anesthesia complications

## 2013-12-12 NOTE — Transfer of Care (Signed)
Immediate Anesthesia Transfer of Care Note  Patient: Rebecca Crosby  Procedure(s) Performed: Procedure(s): OPEN REDUCTION INTERNAL FIXATION (ORIF) RIGHT RING FINGER METACARPAL (Right)  Patient Location: PACU  Anesthesia Type:GA combined with regional for post-op pain  Level of Consciousness: awake, sedated and patient cooperative  Airway & Oxygen Therapy: Patient Spontanous Breathing and Patient connected to face mask oxygen  Post-op Assessment: Report given to PACU RN and Post -op Vital signs reviewed and stable  Post vital signs: Reviewed and stable  Complications: No apparent anesthesia complications

## 2013-12-12 NOTE — Op Note (Signed)
Intra-operative fluoroscopic images in the AP, lateral, and oblique views were taken and evaluated by myself.  Reduction and hardware placement were confirmed.  There was no intraarticular penetration of permanent hardware.  

## 2013-12-12 NOTE — Op Note (Signed)
[id number] 

## 2013-12-12 NOTE — Progress Notes (Signed)
Assisted Dr. Crews with right, ultrasound guided, supraclavicular block. Side rails up, monitors on throughout procedure. See vital signs in flow sheet. Tolerated Procedure well. 

## 2013-12-12 NOTE — Brief Op Note (Signed)
12/12/2013  12:44 PM  PATIENT:  Rebecca Crosby  23 y.o. female  PRE-OPERATIVE DIAGNOSIS:  right ring metacarpal fracture  POST-OPERATIVE DIAGNOSIS:  right ring metacarpal fracture  PROCEDURE:  Procedure(s): OPEN REDUCTION INTERNAL FIXATION (ORIF) RIGHT RING FINGER METACARPAL (Right)  SURGEON:  Surgeon(s) and Role:    * Tami RibasKevin R Crystal Ellwood, MD - Primary  PHYSICIAN ASSISTANT:   ASSISTANTS: none   ANESTHESIA:   regional and general  EBL:  Total I/O In: 300 [I.V.:300] Out: -   BLOOD ADMINISTERED:none  DRAINS: none   LOCAL MEDICATIONS USED:  NONE  SPECIMEN:  No Specimen  DISPOSITION OF SPECIMEN:  N/A  COUNTS:  YES  TOURNIQUET:   Total Tourniquet Time Documented: Upper Arm (Right) - 62 minutes Total: Upper Arm (Right) - 62 minutes   DICTATION: .Other Dictation: Dictation Number [id number]  PLAN OF CARE: Discharge to home after PACU  PATIENT DISPOSITION:  PACU - hemodynamically stable.

## 2013-12-12 NOTE — Anesthesia Preprocedure Evaluation (Signed)
Anesthesia Evaluation  Patient identified by MRN, date of birth, ID band Patient awake    Reviewed: Allergy & Precautions, H&P , NPO status , Patient's Chart, lab work & pertinent test results  Airway Mallampati: I  TM Distance: >3 FB Neck ROM: Full    Dental  (+) Teeth Intact, Dental Advisory Given   Pulmonary Current Smoker,  breath sounds clear to auscultation        Cardiovascular Rhythm:Regular Rate:Normal     Neuro/Psych    GI/Hepatic   Endo/Other    Renal/GU      Musculoskeletal   Abdominal   Peds  Hematology   Anesthesia Other Findings   Reproductive/Obstetrics                             Anesthesia Physical Anesthesia Plan  ASA: I  Anesthesia Plan: General   Post-op Pain Management:    Induction: Intravenous  Airway Management Planned: LMA  Additional Equipment:   Intra-op Plan:   Post-operative Plan: Extubation in OR  Informed Consent: I have reviewed the patients History and Physical, chart, labs and discussed the procedure including the risks, benefits and alternatives for the proposed anesthesia with the patient or authorized representative who has indicated his/her understanding and acceptance.   Dental advisory given  Plan Discussed with: CRNA, Anesthesiologist and Surgeon  Anesthesia Plan Comments:         Anesthesia Quick Evaluation  

## 2013-12-12 NOTE — Anesthesia Procedure Notes (Addendum)
Anesthesia Regional Block:  Supraclavicular block  Pre-Anesthetic Checklist: ,, timeout performed, Correct Patient, Correct Site, Correct Laterality, Correct Procedure, Correct Position, site marked, Risks and benefits discussed,  Surgical consent,  Pre-op evaluation,  At surgeon's request and post-op pain management  Laterality: Right and Upper  Prep: chloraprep       Needles:  Injection technique: Single-shot  Needle Type: Echogenic Stimulator Needle     Needle Length: 5cm 5 cm Needle Gauge: 21 and 21 G    Additional Needles:  Procedures: ultrasound guided (picture in chart) Supraclavicular block Narrative:  Start time: 12/12/2013 11:08 AM End time: 12/12/2013 11:14 AM Injection made incrementally with aspirations every 5 mL.  Performed by: Personally  Anesthesiologist: Sheldon Silvanavid Crews   Procedure Name: LMA Insertion Date/Time: 12/12/2013 11:32 AM Performed by: Gar GibbonKEETON, Necha Harries S Pre-anesthesia Checklist: Patient identified, Emergency Drugs available, Suction available and Patient being monitored Patient Re-evaluated:Patient Re-evaluated prior to inductionOxygen Delivery Method: Circle System Utilized Preoxygenation: Pre-oxygenation with 100% oxygen Intubation Type: IV induction Ventilation: Mask ventilation without difficulty LMA: LMA inserted LMA Size: 3.0 Number of attempts: 1 Airway Equipment and Method: bite block Placement Confirmation: positive ETCO2 Tube secured with: Tape Dental Injury: Teeth and Oropharynx as per pre-operative assessment

## 2013-12-13 NOTE — Op Note (Signed)
NAMHarrel Lemon:  Rebecca Crosby, Rebecca Crosby                ACCOUNT NO.:  [id number]  MEDICAL RECORD NO.:  [id number]  LOCATION:                                 FACILITY:  PHYSICIAN:  Betha LoaKevin Myasia Sinatra, MD        DATE OF BIRTH:  10/04/1990  DATE OF PROCEDURE:  12/12/2013 DATE OF DISCHARGE:                              OPERATIVE REPORT   PREOPERATIVE DIAGNOSIS:  Right ring finger metacarpal fracture.  POSTOPERATIVE DIAGNOSIS:  Right ring finger metacarpal fracture.  PROCEDURE:  Open reduction and internal fixation right ring finger metacarpal fracture.  SURGEON:  Betha LoaKevin Allee Busk, MD  ASSISTANT:  None.  ANESTHESIA:  General with regional.  IV FLUIDS:  Per anesthesia flow sheet.  ESTIMATED BLOOD LOSS:  Minimal.  COMPLICATIONS:  None.  SPECIMENS:  None.  TIME OF TOURNIQUET:  61 minutes.  DISPOSITION:  Stable to PACU.  INDICATIONS:  Ms. Manson PasseyBrown is a 23 year old right-hand-dominant female who injured her right hand when she fell while camping on December 03, 2013.  [facility] where radiographs were taken revealing a right ring finger spiral fracture.  She then followed up in the office.  We discussed nonoperative and operative treatment options. Risks, benefits, and alternatives of surgery were discussed including risk of blood loss, infection, damage to nerves, vessels, tendons, ligaments, bone, failure of surgery, need for additional surgery, complications with wound healing, continued pain, nonunion, malunion, stiffness.  She voiced understanding of these risks and elected to proceed.  OPERATIVE COURSE:  After being identified preoperatively by myself, the patient and I agreed upon procedure and site of procedure.  Surgical site was marked.  The risks, benefits, and alternatives of surgery were reviewed and she wished to proceed.  Surgical consent had been signed. She was given IV Ancef as preoperative antibiotic prophylaxis.  She was transported to the operating room,  placed on the operating room table in supine position with the right upper extremity on arm board.  A regional block had been performed by Anesthesia in preoperative holding.  General anesthesia was induced in the operating room.  The right upper extremity was prepped and draped in normal sterile orthopedic fashion.  A surgical pause was performed between surgeons, anesthesia, and operating room staff, and all were in agreement as to the patient, procedure, and site of procedure.  Tourniquet at the proximal aspect of the extremity was inflated to 250 mmHg after exsanguination of the limb with an Esmarch bandage.  Incision was made on the dorsum of the hand over the ring finger metacarpal and carried into subcutaneous tissues by spreading technique.  Bipolar electrocautery was used to obtain hemostasis.  A dorsal branch of the ulnar nerve was identified and protected throughout the case.  It did get somewhat stretched but was intact.  The periosteum was incised sharply and elevated with periosteal elevator.  The fracture site was easily identified.  It was displaced proximally and nondisplaced distally.  It was cleared of clot in soft tissue interposition.  It was reduced under direct visualization.  A straight plate from the ALPS set was selected and cut to length.  It was secured to the bone with  the guide pins.  C-arm was used in AP and lateral projections to ensure appropriate reduction, position of the hardware which was the case.  Standard AO drilling and measuring technique was used throughout.  All screw holes were filled.  Good purchase was obtained in all screw holes.  The distal most screw holes were filled with a locking screw as it was in the metacarpal head.  The remaining were nonlocking screws.  C-arm was used in AP, lateral, and oblique projections to ensure proper reduction and position of hardware which was the case.  The wrist was placed through a tenodesis and there was  no scissoring of the ring finger.  The wound was copiously irrigated with sterile saline.  Periosteum was repaired back over top of the plate using 4-0 Vicryl suture in a running fashion.  A tenotomy at the juncture of tendon had been performed for the exposure and this was repaired with a 4-0 Mersilene suture.  Three inverted upward Vicryl sutures were placed in subcutaneous tissues and the skin was closed with a running subcuticular 5-0 Monocryl, which was augmented with Steri- Strips.  The wound was then dressed with sterile 4x4s and wrapped with a Kerlix bandage.  A volar dorsal slab splint including the index, long, ring and small fingers with the MPs flexed and IPs extended was placed and wrapped with Kerlix and Ace bandage.  Tourniquet was deflated at 61 minutes.  Fingertips were pink with brisk capillary refill after deflation of tourniquet.  Operative drapes were broken down and the patient was awoken from anesthesia safely.  She was transferred back to the stretcher and taken to PACU in stable condition.  I will see her back in the office in 1 week for postoperative followup.  We will give Percocet 5/325 one to two p.o. q.6 hours p.r.n. pain, dispensed #40.     Betha Loa, MD     KK/MEDQ  D:  12/12/2013  T:  12/12/2013  Job:  [id number]

## 2013-12-14 ENCOUNTER — Encounter (HOSPITAL_BASED_OUTPATIENT_CLINIC_OR_DEPARTMENT_OTHER): Payer: Self-pay | Admitting: Orthopedic Surgery

## 2014-06-25 ENCOUNTER — Emergency Department (HOSPITAL_COMMUNITY): Payer: 59

## 2014-06-25 ENCOUNTER — Inpatient Hospital Stay (HOSPITAL_COMMUNITY)
Admission: EM | Admit: 2014-06-25 | Discharge: 2014-06-27 | DRG: 084 | Disposition: A | Discharge status: Home / Self Care | Payer: 59 | Attending: General Surgery | Admitting: General Surgery

## 2014-06-25 ENCOUNTER — Encounter (HOSPITAL_COMMUNITY): Payer: Self-pay | Admitting: Neurology

## 2014-06-25 DIAGNOSIS — S0292XA Unspecified fracture of facial bones, initial encounter for closed fracture: Secondary | ICD-10-CM | POA: Diagnosis present

## 2014-06-25 DIAGNOSIS — W109XXA Fall (on) (from) unspecified stairs and steps, initial encounter: Secondary | ICD-10-CM | POA: Diagnosis present

## 2014-06-25 DIAGNOSIS — S065XAA Traumatic subdural hemorrhage with loss of consciousness status unknown, initial encounter: Secondary | ICD-10-CM

## 2014-06-25 DIAGNOSIS — W19XXXA Unspecified fall, initial encounter: Secondary | ICD-10-CM | POA: Diagnosis present

## 2014-06-25 DIAGNOSIS — S065X9A Traumatic subdural hemorrhage with loss of consciousness of unspecified duration, initial encounter: Principal | ICD-10-CM | POA: Diagnosis present

## 2014-06-25 DIAGNOSIS — S02401A Maxillary fracture, unspecified, initial encounter for closed fracture: Secondary | ICD-10-CM | POA: Diagnosis present

## 2014-06-25 DIAGNOSIS — Y908 Blood alcohol level of 240 mg/100 ml or more: Secondary | ICD-10-CM | POA: Diagnosis present

## 2014-06-25 DIAGNOSIS — S069XAA Unspecified intracranial injury with loss of consciousness status unknown, initial encounter: Secondary | ICD-10-CM | POA: Diagnosis present

## 2014-06-25 DIAGNOSIS — S02402A Zygomatic fracture, unspecified, initial encounter for closed fracture: Secondary | ICD-10-CM | POA: Diagnosis present

## 2014-06-25 DIAGNOSIS — S0181XA Laceration without foreign body of other part of head, initial encounter: Secondary | ICD-10-CM | POA: Diagnosis present

## 2014-06-25 DIAGNOSIS — G9389 Other specified disorders of brain: Secondary | ICD-10-CM | POA: Diagnosis present

## 2014-06-25 DIAGNOSIS — M25512 Pain in left shoulder: Secondary | ICD-10-CM | POA: Diagnosis present

## 2014-06-25 DIAGNOSIS — S069X9A Unspecified intracranial injury with loss of consciousness of unspecified duration, initial encounter: Secondary | ICD-10-CM | POA: Diagnosis present

## 2014-06-25 DIAGNOSIS — F1010 Alcohol abuse, uncomplicated: Secondary | ICD-10-CM | POA: Diagnosis present

## 2014-06-25 DIAGNOSIS — F10929 Alcohol use, unspecified with intoxication, unspecified: Secondary | ICD-10-CM | POA: Diagnosis present

## 2014-06-25 DIAGNOSIS — R4182 Altered mental status, unspecified: Secondary | ICD-10-CM | POA: Diagnosis not present

## 2014-06-25 DIAGNOSIS — F17210 Nicotine dependence, cigarettes, uncomplicated: Secondary | ICD-10-CM | POA: Diagnosis present

## 2014-06-25 DIAGNOSIS — S028XXA Fractures of other specified skull and facial bones, initial encounter for closed fracture: Secondary | ICD-10-CM | POA: Diagnosis present

## 2014-06-25 DIAGNOSIS — Z23 Encounter for immunization: Secondary | ICD-10-CM

## 2014-06-25 LAB — ABO/RH: ABO/RH(D): B POS

## 2014-06-25 LAB — CBC WITH DIFFERENTIAL/PLATELET
Basophils Absolute: 0 10*3/uL (ref 0.0–0.1)
Basophils Relative: 0 % (ref 0–1)
Eosinophils Absolute: 0.1 10*3/uL (ref 0.0–0.7)
Eosinophils Relative: 1 % (ref 0–5)
HCT: 43.2 % (ref 36.0–46.0)
Hemoglobin: 15.1 g/dL — ABNORMAL HIGH (ref 12.0–15.0)
Lymphocytes Relative: 14 % (ref 12–46)
Lymphs Abs: 1 10*3/uL (ref 0.7–4.0)
MCH: 30.6 pg (ref 26.0–34.0)
MCHC: 35 g/dL (ref 30.0–36.0)
MCV: 87.6 fL (ref 78.0–100.0)
Monocytes Absolute: 0.3 10*3/uL (ref 0.1–1.0)
Monocytes Relative: 5 % (ref 3–12)
Neutro Abs: 6 10*3/uL (ref 1.7–7.7)
Neutrophils Relative %: 80 % — ABNORMAL HIGH (ref 43–77)
Platelets: 147 10*3/uL — ABNORMAL LOW (ref 150–400)
RBC: 4.93 MIL/uL (ref 3.87–5.11)
RDW: 12.1 % (ref 11.5–15.5)
WBC: 7.5 10*3/uL (ref 4.0–10.5)

## 2014-06-25 LAB — BASIC METABOLIC PANEL
Anion gap: 12 (ref 5–15)
BUN: 14 mg/dL (ref 6–23)
CO2: 24 mmol/L (ref 19–32)
Calcium: 9 mg/dL (ref 8.4–10.5)
Chloride: 101 mmol/L (ref 96–112)
Creatinine, Ser: 0.87 mg/dL (ref 0.50–1.10)
GFR calc Af Amer: >90 mL/min (ref >90)
GFR calc non Af Amer: >90 mL/min (ref >90)
Glucose, Bld: 100 mg/dL — ABNORMAL HIGH (ref 70–99)
Potassium: 4.2 mmol/L (ref 3.5–5.1)
Sodium: 137 mmol/L (ref 135–145)

## 2014-06-25 LAB — TYPE AND SCREEN
ABO/RH(D): B POS
Antibody Screen: NEGATIVE

## 2014-06-25 LAB — LACTIC ACID, PLASMA: Lactic Acid, Venous: 3.3 mmol/L (ref 0.5–2.0)

## 2014-06-25 LAB — ETHANOL: Alcohol, Ethyl (B): 249 mg/dL — ABNORMAL HIGH (ref 0–9)

## 2014-06-25 LAB — PREGNANCY, URINE: PREG TEST UR: NEGATIVE

## 2014-06-25 MED ORDER — MORPHINE SULFATE 2 MG/ML IJ SOLN
1.0000 mg | INTRAMUSCULAR | Status: DC | PRN
  Administered 2014-06-26: 1 mg via INTRAVENOUS
  Administered 2014-06-26: 2 mg via INTRAVENOUS
  Administered 2014-06-26 (×2): 1 mg via INTRAVENOUS
  Administered 2014-06-26 – 2014-06-27 (×3): 2 mg via INTRAVENOUS
  Filled 2014-06-25 (×7): qty 1

## 2014-06-25 MED ORDER — OXYCODONE HCL 5 MG PO TABS
5.0000 mg | ORAL_TABLET | ORAL | Status: DC | PRN
  Administered 2014-06-26 (×3): 5 mg via ORAL
  Filled 2014-06-25 (×3): qty 1

## 2014-06-25 MED ORDER — CEFAZOLIN SODIUM 1-5 GM-% IV SOLN
1.0000 g | Freq: Once | INTRAVENOUS | Status: AC
  Administered 2014-06-25: 1 g via INTRAVENOUS
  Filled 2014-06-25: qty 50

## 2014-06-25 MED ORDER — CEFAZOLIN SODIUM-DEXTROSE 2-3 GM-% IV SOLR
INTRAVENOUS | Status: AC
  Filled 2014-06-25: qty 50

## 2014-06-25 MED ORDER — DOCUSATE SODIUM 100 MG PO CAPS
100.0000 mg | ORAL_CAPSULE | Freq: Two times a day (BID) | ORAL | Status: DC
  Administered 2014-06-26 – 2014-06-27 (×3): 100 mg via ORAL
  Filled 2014-06-25 (×4): qty 1

## 2014-06-25 MED ORDER — PANTOPRAZOLE SODIUM 40 MG PO TBEC
40.0000 mg | DELAYED_RELEASE_TABLET | Freq: Every day | ORAL | Status: DC
  Administered 2014-06-26 – 2014-06-27 (×2): 40 mg via ORAL
  Filled 2014-06-25 (×2): qty 1

## 2014-06-25 MED ORDER — ONDANSETRON HCL 4 MG PO TABS: 4.0000 mg | ORAL_TABLET | Freq: Four times a day (QID) | ORAL | Status: DC | PRN

## 2014-06-25 MED ORDER — LIDOCAINE HCL (PF) 1 % IJ SOLN
INTRAMUSCULAR | Status: AC
  Filled 2014-06-25: qty 5

## 2014-06-25 MED ORDER — TETANUS-DIPHTH-ACELL PERTUSSIS 5-2.5-18.5 LF-MCG/0.5 IM SUSP
0.5000 mL | Freq: Once | INTRAMUSCULAR | Status: AC
  Administered 2014-06-25: 0.5 mL via INTRAMUSCULAR
  Filled 2014-06-25: qty 0.5

## 2014-06-25 MED ORDER — LIDOCAINE HCL (PF) 1 % IJ SOLN: 5.0000 mL | Freq: Once | INTRAMUSCULAR | Status: DC

## 2014-06-25 MED ORDER — PANTOPRAZOLE SODIUM 40 MG IV SOLR
40.0000 mg | Freq: Every day | INTRAVENOUS | Status: DC
Start: 2014-06-26 — End: 2014-06-26
  Filled 2014-06-25: qty 40

## 2014-06-25 MED ORDER — BISACODYL 10 MG RE SUPP: 10.0000 mg | Freq: Every day | RECTAL | Status: DC | PRN

## 2014-06-25 MED ORDER — ONDANSETRON HCL 4 MG/2ML IJ SOLN
4.0000 mg | Freq: Four times a day (QID) | INTRAMUSCULAR | Status: DC | PRN
  Administered 2014-06-26: 4 mg via INTRAVENOUS
  Filled 2014-06-25: qty 2

## 2014-06-25 MED ORDER — SODIUM CHLORIDE 0.9 % IV SOLN
INTRAVENOUS | Status: DC
Start: 2014-06-25 — End: 2014-06-27
  Administered 2014-06-25 – 2014-06-27 (×3): via INTRAVENOUS

## 2014-06-25 MED ORDER — LIDOCAINE-EPINEPHRINE-TETRACAINE (LET) SOLUTION
3.0000 mL | Freq: Once | NASAL | Status: AC
  Administered 2014-06-25: 3 mL via TOPICAL
  Filled 2014-06-25: qty 3

## 2014-06-25 NOTE — ED Notes (Signed)
Pt's boyfriend, Susy FrizzleMatt arrived. Reports pt drank 4 shots, 6 beers today.

## 2014-06-25 NOTE — H&P (Signed)
History   Rebecca Crosby is an 24 y.o. female.   Chief Complaint:  Chief Complaint  Patient presents with  . Altered Mental Status    Altered Mental Status Trauma Mechanism of injury: fall Injury location: face and head/neck Injury location detail: head and L eye Incident location: home Time since incident: 4 hours   Fall:      Fall occurred: down stairs      Height of fall: 6 stairs      Impact surface: hard floor      Point of impact: head and face      Entrapped after fall: no  Protective equipment:       None      Suspicion of alcohol use: yes  EMS/PTA data:      Ambulatory at scene: no      Blood loss: minimal      Responsiveness: unresponsive      Loss of consciousness: yes      Airway interventions: none      Breathing interventions: none      IV access: established      Fluids administered: normal saline      Medications administered: none      Immobilization: C-collar and long board  Current symptoms:      Associated symptoms:            Reports loss of consciousness.    Past Medical History  Diagnosis Date  . Metacarpal bone fracture 12/03/2013    right ring  . History of anemia 2008  . History of kidney stones     Past Surgical History  Procedure Laterality Date  . No past surgeries    . Open reduction internal fixation (orif) metacarpal Right 12/12/2013    Procedure: OPEN REDUCTION INTERNAL FIXATION (ORIF) RIGHT RING FINGER METACARPAL;  Surgeon: Tennis Must, MD;  Location: [facility];  Service: Orthopedics;  Laterality: Right;    History reviewed. No pertinent family history. Social History:  reports that she has been smoking Cigarettes.  She has been smoking about 0.50 packs per day. She has never used smokeless tobacco. She reports that she drinks alcohol. She reports that she does not use illicit drugs.  Allergies  No Known Allergies  Home Medications   (Not in a hospital admission)  Trauma Course   Results for orders  placed or performed during the hospital encounter of 06/25/14 (from the past 48 hour(s))  CBC with Differential     Status: Abnormal   Collection Time: 06/25/14  8:21 PM  Result Value Ref Range   WBC 7.5 4.0 - 10.5 K/uL   RBC 4.93 3.87 - 5.11 MIL/uL   Hemoglobin 15.1 (H) 12.0 - 15.0 g/dL   HCT 43.2 36.0 - 46.0 %   MCV 87.6 78.0 - 100.0 fL   MCH 30.6 26.0 - 34.0 pg   MCHC 35.0 30.0 - 36.0 g/dL   RDW 12.1 11.5 - 15.5 %   Platelets 147 (L) 150 - 400 K/uL   Neutrophils Relative % 80 (H) 43 - 77 %   Neutro Abs 6.0 1.7 - 7.7 K/uL   Lymphocytes Relative 14 12 - 46 %   Lymphs Abs 1.0 0.7 - 4.0 K/uL   Monocytes Relative 5 3 - 12 %   Monocytes Absolute 0.3 0.1 - 1.0 K/uL   Eosinophils Relative 1 0 - 5 %   Eosinophils Absolute 0.1 0.0 - 0.7 K/uL   Basophils Relative 0 0 - 1 %  Basophils Absolute 0.0 0.0 - 0.1 K/uL  Basic metabolic panel     Status: Abnormal   Collection Time: 06/25/14  8:21 PM  Result Value Ref Range   Sodium 137 135 - 145 mmol/L   Potassium 4.2 3.5 - 5.1 mmol/L   Chloride 101 96 - 112 mmol/L   CO2 24 19 - 32 mmol/L   Glucose, Bld 100 (H) 70 - 99 mg/dL   BUN 14 6 - 23 mg/dL   Creatinine, Ser 0.87 0.50 - 1.10 mg/dL   Calcium 9.0 8.4 - 10.5 mg/dL   GFR calc non Af Amer >90 >90 mL/min   GFR calc Af Amer >90 >90 mL/min    Comment: (NOTE) The eGFR has been calculated using the CKD EPI equation. This calculation has not been validated in all clinical situations. eGFR's persistently <90 mL/min signify possible Chronic Kidney Disease.    Anion gap 12 5 - 15  Ethanol     Status: Abnormal   Collection Time: 06/25/14  8:21 PM  Result Value Ref Range   Alcohol, Ethyl (B) 249 (H) 0 - 9 mg/dL    Comment:        LOWEST DETECTABLE LIMIT FOR SERUM ALCOHOL IS 11 mg/dL FOR MEDICAL PURPOSES ONLY   Lactic acid, plasma     Status: Abnormal   Collection Time: 06/25/14  8:21 PM  Result Value Ref Range   Lactic Acid, Venous 3.3 (HH) 0.5 - 2.0 mmol/L    Comment: Please note  change in reference range. REPEATED TO VERIFY CRITICAL RESULT CALLED TO, READ BACK BY AND VERIFIED WITH: LAMBERT R,RN 06/25/14 2123 WAYK   Type and screen     Status: None   Collection Time: 06/25/14  8:21 PM  Result Value Ref Range   ABO/RH(D) B POS    Antibody Screen NEG    Sample Expiration 06/28/2014   ABO/Rh     Status: None   Collection Time: 06/25/14  8:21 PM  Result Value Ref Range   ABO/RH(D) B POS    Ct Head Wo Contrast  06/25/2014   CLINICAL DATA:  Pt tripped down 6 stairs, fell and has laceration to left eye. No LOC. C/o pain to left eye.ETOH positivePatient VERY uncooperative  EXAM: CT HEAD WITHOUT CONTRAST  CT CERVICAL SPINE WITHOUT CONTRAST  TECHNIQUE: Multidetector CT imaging of the head and cervical spine was performed following the standard protocol without intravenous contrast. Multiplanar CT image reconstructions of the cervical spine were also generated.  COMPARISON:  None.  FINDINGS: CT HEAD FINDINGS  Small subdural hemorrhage extends along the anterior left temporal lobe and over the antral lateral left frontal lobe. There is extra-axial air anterior to the left temporal lobe related to adjacent fractures. Probable minimal subarachnoid hemorrhage also evident along the anterior inferior left temporal lobe.  No other intracranial hemorrhage.  Ventricles normal in size and configuration. No parenchymal masses or mass effect. No evidence of an infarct.  Multiple left-sided facial fractures. There are comminuted fractures of the maxillary sinus involving the anterior lateral walls with fracture displacement and depression. There is a nondisplaced fracture that extends to the left middle cranial fossa floor. Fractures are also breech the lateral left sphenoid sinus wall. Fractures appear to involve the posterior orbital floor on the left. There is dependent hemorrhage in the left maxillary sinus as well as dependent fluid in the left posterior ethmoid air cell and both sphenoid  sinuses. Air extends into the superior orbital fissure on the left and into  the infratemporal fossa and middle cranial fossa.  No skull fracture.  CT CERVICAL SPINE FINDINGS  No fracture. No spondylolisthesis. No degenerative changes are evident. No disc herniation. Soft tissues are unremarkable. Lung apices are clear.  IMPRESSION: HEAD CT: Small amount of subdural hemorrhage lies along the anterior inferior margin of the left temporal lobe and antero lateral left frontal lobe. There is some extra-axial air along the left middle cranial fossa adjacent to a nondisplaced middle cranial fossa floor fracture. Probable trace of subarachnoid hemorrhage along the inferior anterior left frontal lobe as well.  No other intracranial hemorrhage. No evidence of parenchymal hemorrhage. No parenchymal masses or mass effect.  Significant left-sided facial fractures. Consider follow-up maxillofacial CT for further evaluation.  CERVICAL CT:  Normal.  No fracture or acute finding.   Electronically Signed   By: Lajean Manes M.D.   On: 06/25/2014 19:48   Ct Cervical Spine Wo Contrast  06/25/2014   CLINICAL DATA:  Pt tripped down 6 stairs, fell and has laceration to left eye. No LOC. C/o pain to left eye.ETOH positivePatient VERY uncooperative  EXAM: CT HEAD WITHOUT CONTRAST  CT CERVICAL SPINE WITHOUT CONTRAST  TECHNIQUE: Multidetector CT imaging of the head and cervical spine was performed following the standard protocol without intravenous contrast. Multiplanar CT image reconstructions of the cervical spine were also generated.  COMPARISON:  None.  FINDINGS: CT HEAD FINDINGS  Small subdural hemorrhage extends along the anterior left temporal lobe and over the antral lateral left frontal lobe. There is extra-axial air anterior to the left temporal lobe related to adjacent fractures. Probable minimal subarachnoid hemorrhage also evident along the anterior inferior left temporal lobe.  No other intracranial hemorrhage.  Ventricles  normal in size and configuration. No parenchymal masses or mass effect. No evidence of an infarct.  Multiple left-sided facial fractures. There are comminuted fractures of the maxillary sinus involving the anterior lateral walls with fracture displacement and depression. There is a nondisplaced fracture that extends to the left middle cranial fossa floor. Fractures are also breech the lateral left sphenoid sinus wall. Fractures appear to involve the posterior orbital floor on the left. There is dependent hemorrhage in the left maxillary sinus as well as dependent fluid in the left posterior ethmoid air cell and both sphenoid sinuses. Air extends into the superior orbital fissure on the left and into the infratemporal fossa and middle cranial fossa.  No skull fracture.  CT CERVICAL SPINE FINDINGS  No fracture. No spondylolisthesis. No degenerative changes are evident. No disc herniation. Soft tissues are unremarkable. Lung apices are clear.  IMPRESSION: HEAD CT: Small amount of subdural hemorrhage lies along the anterior inferior margin of the left temporal lobe and antero lateral left frontal lobe. There is some extra-axial air along the left middle cranial fossa adjacent to a nondisplaced middle cranial fossa floor fracture. Probable trace of subarachnoid hemorrhage along the inferior anterior left frontal lobe as well.  No other intracranial hemorrhage. No evidence of parenchymal hemorrhage. No parenchymal masses or mass effect.  Significant left-sided facial fractures. Consider follow-up maxillofacial CT for further evaluation.  CERVICAL CT:  Normal.  No fracture or acute finding.   Electronically Signed   By: Lajean Manes M.D.   On: 06/25/2014 19:48   Ct Maxillofacial Wo Cm  06/25/2014   CLINICAL DATA:  Acute traumatic injury with middle cranial fossa fractures and left facial fractures. Initial encounter.  EXAM: CT MAXILLOFACIAL WITHOUT CONTRAST  TECHNIQUE: Multidetector CT imaging of the maxillofacial  structures  was performed. Multiplanar CT image reconstructions were also generated. A small metallic BB was placed on the right temple in order to reliably differentiate right from left.  COMPARISON:  Prior head CT performed earlier on the same day.  FINDINGS: Again seen is an acute nondisplaced fracture extending through the floor of the left middle cranial fossa (series 202, image 30). Scattered pneumocephalus with subjacent subdural and probable subarachnoid hemorrhage again seen. There is a fracture medially involving the left optic canal towards the left orbital apex, extending inferiorly towards the left pterygopalatine fossa. There is an acute nondisplaced fracture through the lateral wall of the left sphenoid sinus, with additional subtle acute nondisplaced fracture through the posterior wall of the right sphenoid sinus. Layering blood present within the sphenoid sinuses. No involvement of the carotid canals. Small amount of gas present within the sella.  Additional acute nondisplaced fracture through the posterior aspect of the left orbital floor, best appreciated on sagittal sequence (series 2010, image 52). Small amount a gas present within knee superior aspect of the bony left orbit.  There are acute comminuted mildly displaced fractures through the anterior and posterior walls the left maxillary sinus. Layering blood present within the left maxillary sinus. Acute nondisplaced fracture extends through the posterior margin of the left zygomatic arch into the left temporomandibular fossa (series 202, image 34). The left mandibular condyle intact and normally positioned.  There is a subtle acute nondisplaced fracture through the lateral limb of the left pterygoid plate.  Mandible is intact.  No right-sided maxillofacial fractures.  Swelling with soft tissue emphysema present within the left face and left infratemporal fossa.  The extraocular muscles and optic nerves are symmetric and within normal limits.   Visualized mastoid air cells clear as are the middle ear cavities.  IMPRESSION: 1. Acute nondisplaced fracture through the anterior floor of the left middle cranial fossa with medial extension into the left optic canal and left sphenoid sinus. No involvement of the carotid canals is identified. Subdural and subarachnoid hemorrhage with pneumocephalus within the left middle cranial fossa as before. 2. Acute nondisplaced fracture through the posterior floor of the bony left orbit without displacement. There is slight left-sided proptosis without frank retro-orbital hematoma. The globes are intact. 3. Acute comminuted fractures through the anterior and posterior walls of the left maxillary sinus. 4. Acute nondisplaced fracture through the posterior left zygomatic arch. 5. Acute nondisplaced fracture through the lateral limb of the left pterygoid plate. 6. Hemo sinus within the sphenoid and left maxillary sinus. 7. Left facial and periorbital contusion with emphysema.   Electronically Signed   By: Jeannine Boga M.D.   On: 06/25/2014 21:57    Review of Systems  Unable to perform ROS: patient nonverbal  Eyes: Positive for blurred vision.  Neurological: Positive for loss of consciousness.    Blood pressure 104/60, pulse 78, temperature 97.4 F (36.3 C), temperature source Oral, resp. rate 26, last menstrual period 06/18/2014, SpO2 97 %. Physical Exam  Constitutional: She appears well-developed and well-nourished. She appears listless.  Eyes: EOM are normal. Pupils are equal, round, and reactive to light.    Cardiovascular: Normal rate and normal heart sounds.   Respiratory: Effort normal and breath sounds normal.  GI: Soft. Bowel sounds are normal.  Neurological: She appears listless. No cranial nerve deficit or sensory deficit. GCS eye subscore is 2. GCS verbal subscore is 2. GCS motor subscore is 5.  Skin: Skin is warm and dry.     Assessment/Plan Fall down  stairs while  intoxicated. Multiple facial fractures SDH without pressure or herniation Left periorbital laceration  Neurosurgery has seen the patient  Maxillo facial needs to consult for facial fractures Admit to ICU for observation and repeat examinations in the AM   Morey Andonian, JAY 06/25/2014, 10:39 PM   Procedures

## 2014-06-25 NOTE — ED Provider Notes (Signed)
I saw and evaluated the patient, reviewed the resident's note and I agree with the findings and plan.   EKG Interpretation None      23yF presenting after fall down steps. Drinking alcohol prior to this. No LOC. On exam pt is combative. Placed in c-collar. Not sure how much from ETOH versus head injury. Some difficulty with getting to follow direction and needs frequent redirection. PERRL. EOM function appears intact. No hyphema. No facial droop. Moves all extremities on command and with good strength. L periorbital laceration. Multiple facial fractures with extension through anterior floor L middle cranial fossa. Small amount of SDH and possible SAH. Neurosurgery consulted. Nonoperative. Admit for neuro checks. Trauma consulted for admission. Max/fac consultation for facial fractures. Laceration repaired by Dr Craige CottaSchmitt under my supervision.  CRITICAL CARE Performed by: Raeford RazorKOHUT, Nivaan Dicenzo   Total critical care time: 35 minutes  Critical care time was exclusive of separately billable procedures and treating other patients. Critical care was necessary to treat or prevent imminent or life-threatening deterioration. Critical care was time spent personally by me on the following activities: development of treatment plan with patient and/or surrogate as well as nursing, discussions with consultants, evaluation of patient's response to treatment, examination of patient, obtaining history from patient or surrogate, ordering and performing treatments and interventions, ordering and review of laboratory studies, ordering and review of radiographic studies, pulse oximetry and re-evaluation of patient's condition.   Raeford RazorStephen Gianlucas Evenson, MD 06/25/14 2326

## 2014-06-25 NOTE — ED Provider Notes (Signed)
CSN: [id number]     Arrival date & time 06/25/14  1837 History   First MD Initiated Contact with Patient 06/25/14 1846     Chief Complaint  Patient presents with  . Altered Mental Status     (Consider location/radiation/quality/duration/timing/severity/associated sxs/prior Treatment) HPI Comments: Patient was drinking tonight when she fell down approximately 6-18 stairs.  She then went shopping at [employer]  While at [employer] she became belligerent and was sent to the ED for evaluation.    Patient is a 24 y.o. female presenting with head injury. The history is provided by the EMS personnel and a friend. The history is limited by the condition of the patient. No language interpreter was used.  Head Injury Location:  Frontal Mechanism of injury: fall   Pain details:    Quality:  Unable to specify   Severity:  Unable to specify   Timing:  Unable to specify Chronicity:  New   Past Medical History  Diagnosis Date  . Metacarpal bone fracture 12/03/2013    right ring  . History of anemia 2008  . History of kidney stones    Past Surgical History  Procedure Laterality Date  . No past surgeries    . Open reduction internal fixation (orif) metacarpal Right 12/12/2013    Procedure: OPEN REDUCTION INTERNAL FIXATION (ORIF) RIGHT RING FINGER METACARPAL;  Surgeon: Tami Ribas, MD;  Location: [facility];  Service: Orthopedics;  Laterality: Right;   History reviewed. No pertinent family history. History  Substance Use Topics  . Smoking status: Current Every Day Smoker -- 0.50 packs/day    Types: Cigarettes  . Smokeless tobacco: Never Used  . Alcohol Use: Yes     Comment: occasionally   OB History    No data available     Review of Systems  Unable to perform ROS: Mental status change      Allergies  Review of patient's allergies indicates no known allergies.  Home Medications   Prior to Admission medications   Medication Sig Start Date End Date Taking?  Authorizing Provider  ibuprofen (ADVIL,MOTRIN) 200 MG tablet Take 800 mg by mouth every 6 (six) hours as needed for pain.   Yes Historical Provider, MD  oxyCODONE-acetaminophen (PERCOCET) 5-325 MG per tablet 1-2 tabs po q6 hours prn pain 12/12/13  Yes Betha Loa, MD   BP 101/56 mmHg  Pulse 67  Temp(Src) 98.4 F (36.9 C) (Oral)  Resp 12  Ht  (1.6 m)  Wt 93 lb 14.7 oz (42.6 kg)  BMI 16.64 kg/m2  SpO2 100%  LMP 06/18/2014 Physical Exam  Constitutional: She is oriented to person, place, and time. She appears well-developed and well-nourished. No distress. Cervical collar and backboard in place.  HENT:  Head: Normocephalic. Head is with contusion (left eye).  Eyes: Pupils are equal, round, and reactive to light. Right pupil is round and reactive. Left pupil is round and reactive. Pupils are equal.  4 cm stellate laceration 1 cm lateral to the left lateral canthus.  Hemostatic.   Neck: Normal range of motion.  Cardiovascular: Normal rate, regular rhythm, normal heart sounds and intact distal pulses.   Pulmonary/Chest: Effort normal. No respiratory distress. She has no wheezes. She exhibits no tenderness.  Abdominal: Soft. Bowel sounds are normal. She exhibits no distension. There is no tenderness. There is no rebound and no guarding.  Musculoskeletal:       Cervical back: She exhibits no tenderness, no bony tenderness, no pain and no  spasm.       Thoracic back: She exhibits no tenderness, no bony tenderness, no deformity and no pain.       Lumbar back: She exhibits no tenderness, no bony tenderness, no deformity and no pain.  Neurological: She is alert and oriented to person, place, and time. She has normal strength. No cranial nerve deficit or sensory deficit. She exhibits normal muscle tone. Coordination and gait normal.  Skin: Skin is warm and dry.  Nursing note and vitals reviewed.   ED Course  LACERATION REPAIR Date/Time: 06/25/2014 11:11 PM Performed by: Bethann Berkshire Authorized by: Bethann Berkshire Body area: head/neck Location details: left cheek Laceration length: 4 cm Tendon involvement: none Nerve involvement: none Vascular damage: no Local anesthetic: LET (lido,epi,tetracaine) Preparation: Patient was prepped and draped in the usual sterile fashion. Irrigation solution: saline Irrigation method: syringe Amount of cleaning: extensive Debridement: none Degree of undermining: none Skin closure: 5-0 nylon Number of sutures: 6 Technique: simple Approximation: close Approximation difficulty: simple Dressing: antibiotic ointment Patient tolerance: Patient tolerated the procedure well with no immediate complications   (including critical care time) Labs Review Labs Reviewed  CBC WITH DIFFERENTIAL/PLATELET - Abnormal; Notable for the following:    Hemoglobin 15.1 (*)    Platelets 147 (*)    Neutrophils Relative % 80 (*)    All other components within normal limits  BASIC METABOLIC PANEL - Abnormal; Notable for the following:    Glucose, Bld 100 (*)    All other components within normal limits  ETHANOL - Abnormal; Notable for the following:    Alcohol, Ethyl (B) 249 (*)    All other components within normal limits  LACTIC ACID, PLASMA - Abnormal; Notable for the following:    Lactic Acid, Venous 3.3 (*)    All other components within normal limits  BASIC METABOLIC PANEL - Abnormal; Notable for the following:    Glucose, Bld 110 (*)    Calcium 8.3 (*)    All other components within normal limits  ETHANOL - Abnormal; Notable for the following:    Alcohol, Ethyl (B) 93 (*)    All other components within normal limits  PREGNANCY, URINE  CBC  TYPE AND SCREEN  ABO/RH    Imaging Review Ct Head Wo Contrast  06/26/2014   CLINICAL DATA:  Patient fell down steps last night. Follow-up subdural hematoma. Left-sided headache.  EXAM: CT HEAD WITHOUT CONTRAST  TECHNIQUE: Contiguous axial images were obtained from the base of the skull  through the vertex without intravenous contrast.  COMPARISON:  06/25/2014  FINDINGS: Tiny subdural hematoma seen previously along the left anterior frontotal region is less well demonstrated on the current study. Minimal residual subdural hematoma demonstrated in the anterior middle cranial fossa with associated gas collections, stable since previous study. Left sphenoid, temporal, and maxillary antral fractures again demonstrated. Air-fluid levels in the sphenoid sinus with opacification of left maxillary antrum and ethmoid air cells. No progression. No mass effect or midline shift. No evidence of subarachnoid or intraventricular hematoma.  IMPRESSION: Re identification of subdural hematoma in the left anterior temporal region with less distinct subdural hematoma in the frontal region. No progression. Re identification of left facial and skullbase fractures. No significant change since previous study.   Electronically Signed   By: Burman Nieves M.D.   On: 06/26/2014 06:57   Ct Head Wo Contrast  06/25/2014   CLINICAL DATA:  Pt tripped down 6 stairs, fell and has laceration to left eye. No LOC. C/o  pain to left eye.ETOH positivePatient VERY uncooperative  EXAM: CT HEAD WITHOUT CONTRAST  CT CERVICAL SPINE WITHOUT CONTRAST  TECHNIQUE: Multidetector CT imaging of the head and cervical spine was performed following the standard protocol without intravenous contrast. Multiplanar CT image reconstructions of the cervical spine were also generated.  COMPARISON:  None.  FINDINGS: CT HEAD FINDINGS  Small subdural hemorrhage extends along the anterior left temporal lobe and over the antral lateral left frontal lobe. There is extra-axial air anterior to the left temporal lobe related to adjacent fractures. Probable minimal subarachnoid hemorrhage also evident along the anterior inferior left temporal lobe.  No other intracranial hemorrhage.  Ventricles normal in size and configuration. No parenchymal masses or mass effect.  No evidence of an infarct.  Multiple left-sided facial fractures. There are comminuted fractures of the maxillary sinus involving the anterior lateral walls with fracture displacement and depression. There is a nondisplaced fracture that extends to the left middle cranial fossa floor. Fractures are also breech the lateral left sphenoid sinus wall. Fractures appear to involve the posterior orbital floor on the left. There is dependent hemorrhage in the left maxillary sinus as well as dependent fluid in the left posterior ethmoid air cell and both sphenoid sinuses. Air extends into the superior orbital fissure on the left and into the infratemporal fossa and middle cranial fossa.  No skull fracture.  CT CERVICAL SPINE FINDINGS  No fracture. No spondylolisthesis. No degenerative changes are evident. No disc herniation. Soft tissues are unremarkable. Lung apices are clear.  IMPRESSION: HEAD CT: Small amount of subdural hemorrhage lies along the anterior inferior margin of the left temporal lobe and antero lateral left frontal lobe. There is some extra-axial air along the left middle cranial fossa adjacent to a nondisplaced middle cranial fossa floor fracture. Probable trace of subarachnoid hemorrhage along the inferior anterior left frontal lobe as well.  No other intracranial hemorrhage. No evidence of parenchymal hemorrhage. No parenchymal masses or mass effect.  Significant left-sided facial fractures. Consider follow-up maxillofacial CT for further evaluation.  CERVICAL CT:  Normal.  No fracture or acute finding.   Electronically Signed   By: Amie Portland M.D.   On: 06/25/2014 19:48   Ct Cervical Spine Wo Contrast  06/25/2014   CLINICAL DATA:  Pt tripped down 6 stairs, fell and has laceration to left eye. No LOC. C/o pain to left eye.ETOH positivePatient VERY uncooperative  EXAM: CT HEAD WITHOUT CONTRAST  CT CERVICAL SPINE WITHOUT CONTRAST  TECHNIQUE: Multidetector CT imaging of the head and cervical spine was  performed following the standard protocol without intravenous contrast. Multiplanar CT image reconstructions of the cervical spine were also generated.  COMPARISON:  None.  FINDINGS: CT HEAD FINDINGS  Small subdural hemorrhage extends along the anterior left temporal lobe and over the antral lateral left frontal lobe. There is extra-axial air anterior to the left temporal lobe related to adjacent fractures. Probable minimal subarachnoid hemorrhage also evident along the anterior inferior left temporal lobe.  No other intracranial hemorrhage.  Ventricles normal in size and configuration. No parenchymal masses or mass effect. No evidence of an infarct.  Multiple left-sided facial fractures. There are comminuted fractures of the maxillary sinus involving the anterior lateral walls with fracture displacement and depression. There is a nondisplaced fracture that extends to the left middle cranial fossa floor. Fractures are also breech the lateral left sphenoid sinus wall. Fractures appear to involve the posterior orbital floor on the left. There is dependent hemorrhage in the left maxillary  sinus as well as dependent fluid in the left posterior ethmoid air cell and both sphenoid sinuses. Air extends into the superior orbital fissure on the left and into the infratemporal fossa and middle cranial fossa.  No skull fracture.  CT CERVICAL SPINE FINDINGS  No fracture. No spondylolisthesis. No degenerative changes are evident. No disc herniation. Soft tissues are unremarkable. Lung apices are clear.  IMPRESSION: HEAD CT: Small amount of subdural hemorrhage lies along the anterior inferior margin of the left temporal lobe and antero lateral left frontal lobe. There is some extra-axial air along the left middle cranial fossa adjacent to a nondisplaced middle cranial fossa floor fracture. Probable trace of subarachnoid hemorrhage along the inferior anterior left frontal lobe as well.  No other intracranial hemorrhage. No evidence  of parenchymal hemorrhage. No parenchymal masses or mass effect.  Significant left-sided facial fractures. Consider follow-up maxillofacial CT for further evaluation.  CERVICAL CT:  Normal.  No fracture or acute finding.   Electronically Signed   By: Amie Portlandavid  Ormond M.D.   On: 06/25/2014 19:48   Ct Maxillofacial Wo Cm  06/25/2014   CLINICAL DATA:  Acute traumatic injury with middle cranial fossa fractures and left facial fractures. Initial encounter.  EXAM: CT MAXILLOFACIAL WITHOUT CONTRAST  TECHNIQUE: Multidetector CT imaging of the maxillofacial structures was performed. Multiplanar CT image reconstructions were also generated. A small metallic BB was placed on the right temple in order to reliably differentiate right from left.  COMPARISON:  Prior head CT performed earlier on the same day.  FINDINGS: Again seen is an acute nondisplaced fracture extending through the floor of the left middle cranial fossa (series 202, image 30). Scattered pneumocephalus with subjacent subdural and probable subarachnoid hemorrhage again seen. There is a fracture medially involving the left optic canal towards the left orbital apex, extending inferiorly towards the left pterygopalatine fossa. There is an acute nondisplaced fracture through the lateral wall of the left sphenoid sinus, with additional subtle acute nondisplaced fracture through the posterior wall of the right sphenoid sinus. Layering blood present within the sphenoid sinuses. No involvement of the carotid canals. Small amount of gas present within the sella.  Additional acute nondisplaced fracture through the posterior aspect of the left orbital floor, best appreciated on sagittal sequence (series 2010, image 52). Small amount a gas present within knee superior aspect of the bony left orbit.  There are acute comminuted mildly displaced fractures through the anterior and posterior walls the left maxillary sinus. Layering blood present within the left maxillary sinus.  Acute nondisplaced fracture extends through the posterior margin of the left zygomatic arch into the left temporomandibular fossa (series 202, image 34). The left mandibular condyle intact and normally positioned.  There is a subtle acute nondisplaced fracture through the lateral limb of the left pterygoid plate.  Mandible is intact.  No right-sided maxillofacial fractures.  Swelling with soft tissue emphysema present within the left face and left infratemporal fossa.  The extraocular muscles and optic nerves are symmetric and within normal limits.  Visualized mastoid air cells clear as are the middle ear cavities.  IMPRESSION: 1. Acute nondisplaced fracture through the anterior floor of the left middle cranial fossa with medial extension into the left optic canal and left sphenoid sinus. No involvement of the carotid canals is identified. Subdural and subarachnoid hemorrhage with pneumocephalus within the left middle cranial fossa as before. 2. Acute nondisplaced fracture through the posterior floor of the bony left orbit without displacement. There is slight left-sided proptosis  without frank retro-orbital hematoma. The globes are intact. 3. Acute comminuted fractures through the anterior and posterior walls of the left maxillary sinus. 4. Acute nondisplaced fracture through the posterior left zygomatic arch. 5. Acute nondisplaced fracture through the lateral limb of the left pterygoid plate. 6. Hemo sinus within the sphenoid and left maxillary sinus. 7. Left facial and periorbital contusion with emphysema.   Electronically Signed   By: Rise Mu M.D.   On: 06/25/2014 21:57     EKG Interpretation None      MDM   Final diagnoses:  Fall  laceration of left cheek Pneumocephalus SDH Left zygomatic arch fracture Left orbital wall fracture Left middle cranial fossa fracture Left sphenoid sinus fracture SAH Left maxillary sinus fracture Left pterygoid plate fracture   Patient is a  24 year old Caucasian female with a pertinent past medical history comes to the emergency department today after fall down approximately 6 steps while drinking alcohol. Physical exam as above. Upon arrival patient does have ecchymosis to the left side of her face.  It is difficult to obtain an exam because she is so intoxicated. As result a CT of the head and C-spine was obtained. CT of the head demonstrated pneumocephalus, a small subdural hematoma, and multiple fractures to the left side of the face as result a CBC, BMP, alcohol, lactic acid, and a CT of her face were obtained. These demonstrated multiple facial fractures, an ETOH of 249, and a lactic acid of 3.3. Patient's care was discussed with neurosurgery who felt the patient required admission to trauma they will follow the patient as a consult. They did not recommend treatment with antibiotics for the patient's pneumocephalus.  The patient does have fractures underneath the laceration of the left cheek as a result she was treated with Keflex for an open fracture. Patient's laceration was repaired as detailed above. Trauma surgery was consulted for admission and requested that ENT be consulted as well. ENT was consulted per their request. Patient was admitted to the trauma service in a good condition. Labs and imaging reviewed by myself and considered in medical decision making. Imaging was interpreted by radiology. Care was discussed with my attending Dr. Juleen China.      Bethann Berkshire, MD 06/26/14 1240  Raeford Razor, MD 06/28/14 [phone]

## 2014-06-25 NOTE — Consult Note (Signed)
Reason for Consult:[facility] Referring Physician: EDP  Rebecca Crosby is an 24 y.o. female.   HPI:  24 year old female who reportedly fell down 18 metal stairs while leaving [place]. Reportedly had several drinks. She has amnesia for the event. She complains of headache. She denies numbness tingling or focal weakness. She denies neck pain. No nausea or vomiting. Denies diplopia. Complains of Left facial pain. CT scan showed a tiny amount of extra Axial blood in the left temporal tip and potentially in the left frontal region with dots of pneumocephalus and neurosurgical evaluation was requested. She recently finished a maxillofacial CT for left facial fractures.  Past Medical History  Diagnosis Date  . Metacarpal bone fracture 12/03/2013    right ring  . History of anemia 2008  . History of kidney stones     Past Surgical History  Procedure Laterality Date  . No past surgeries    . Open reduction internal fixation (orif) metacarpal Right 12/12/2013    Procedure: OPEN REDUCTION INTERNAL FIXATION (ORIF) RIGHT RING FINGER METACARPAL;  Surgeon: Tami Ribas, MD;  Location:  [facility];  Service: Orthopedics;  Laterality: Right;    No Known Allergies  History  Substance Use Topics  . Smoking status: Current Every Day Smoker -- 0.50 packs/day    Types: Cigarettes  . Smokeless tobacco: Never Used  . Alcohol Use: Yes     Comment: occasionally    History reviewed. No pertinent family history.   Review of Systems  Positive ROS: Negative  All other systems have been reviewed and were otherwise negative with the exception of those mentioned in the HPI and as above.  Objective: Vital signs in last 24 hours: Temp:  [97.4 F (36.3 C)] 97.4 F (36.3 C) (01/24 1849) Pulse Rate:  [59-89] 89 (01/24 2045) Resp:  [10-29] 10 (01/24 2045) BP: (89-107)/(48-66) 107/66 mmHg (01/24 2045) SpO2:  [93 %-100 %] 99 % (01/24 2045)  General Appearance: Tearful but cooperative young female lying in  a gurney with semirigid cervical collar Head: Normocephalic, abrasions and bruising to left frontal and periorbital region Eyes: PERRL, conjunctiva/corneas clear, EOM's intact  Throat: benign Neck: Supple, symmetrical, trachea midline, no tenderness to palpation Lungs: respirations unlabored Heart: Regular rate and rhythm Extremities: Extremities normal, atraumatic, no cyanosis or edema  Skin: Skin color, texture, turgor normal, no rashes or lesions  NEUROLOGIC:   Mental status: A&O x4, no aphasia, fairly good good attention span given the situation, Memory and fund of knowledge difficult to assess, she certainly has amnesia for the event Motor Exam - grossly normal, normal tone and bulk, as all 4 extremities strongly Sensory Exam - grossly normal to light touch and pressure Reflexes: symmetric, no pathologic reflexes Coordination - grossly normal Gait - not tested Balance - not tested Cranial Nerves: I: smell Not tested  II: visual acuity  OS: na    OD: na  II: visual fields Full to confrontation  II: pupils Equal, round, reactive to light  III,VII: ptosis None  III,IV,VI: extraocular muscles  Full ROM  V: mastication Normal  V: facial light touch sensation  Normal  V,VII: corneal reflex  Present  VII: facial muscle function - upper  Normal  VII: facial muscle function - lower Normal  VIII: hearing Not tested  IX: soft palate elevation  Normal  IX,X: gag reflex Present  XI: trapezius strength  5/5  XI: sternocleidomastoid strength 5/5  XI: neck flexion strength  5/5  XII: tongue strength  Normal  Data Review Lab Results  Component Value Date   WBC 7.5 06/25/2014   HGB 15.1* 06/25/2014   HCT 43.2 06/25/2014   MCV 87.6 06/25/2014   PLT 147* 06/25/2014   Lab Results  Component Value Date   NA 137 06/25/2014   K 4.2 06/25/2014   CL 101 06/25/2014   CO2 24 06/25/2014   BUN 14 06/25/2014   CREATININE 0.87 06/25/2014   GLUCOSE 100* 06/25/2014   No results found  for: INR, PROTIME  Radiology: Ct Head Wo Contrast  06/25/2014   CLINICAL DATA:  Pt tripped down 6 stairs, fell and has laceration to left eye. No LOC. C/o pain to left eye.ETOH positivePatient VERY uncooperative  EXAM: CT HEAD WITHOUT CONTRAST  CT CERVICAL SPINE WITHOUT CONTRAST  TECHNIQUE: Multidetector CT imaging of the head and cervical spine was performed following the standard protocol without intravenous contrast. Multiplanar CT image reconstructions of the cervical spine were also generated.  COMPARISON:  None.  FINDINGS: CT HEAD FINDINGS  Small subdural hemorrhage extends along the anterior left temporal lobe and over the antral lateral left frontal lobe. There is extra-axial air anterior to the left temporal lobe related to adjacent fractures. Probable minimal subarachnoid hemorrhage also evident along the anterior inferior left temporal lobe.  No other intracranial hemorrhage.  Ventricles normal in size and configuration. No parenchymal masses or mass effect. No evidence of an infarct.  Multiple left-sided facial fractures. There are comminuted fractures of the maxillary sinus involving the anterior lateral walls with fracture displacement and depression. There is a nondisplaced fracture that extends to the left middle cranial fossa floor. Fractures are also breech the lateral left sphenoid sinus wall. Fractures appear to involve the posterior orbital floor on the left. There is dependent hemorrhage in the left maxillary sinus as well as dependent fluid in the left posterior ethmoid air cell and both sphenoid sinuses. Air extends into the superior orbital fissure on the left and into the infratemporal fossa and middle cranial fossa.  No skull fracture.  CT CERVICAL SPINE FINDINGS  No fracture. No spondylolisthesis. No degenerative changes are evident. No disc herniation. Soft tissues are unremarkable. Lung apices are clear.  IMPRESSION: HEAD CT: Small amount of subdural hemorrhage lies along the  anterior inferior margin of the left temporal lobe and antero lateral left frontal lobe. There is some extra-axial air along the left middle cranial fossa adjacent to a nondisplaced middle cranial fossa floor fracture. Probable trace of subarachnoid hemorrhage along the inferior anterior left frontal lobe as well.  No other intracranial hemorrhage. No evidence of parenchymal hemorrhage. No parenchymal masses or mass effect.  Significant left-sided facial fractures. Consider follow-up maxillofacial CT for further evaluation.  CERVICAL CT:  Normal.  No fracture or acute finding.   Electronically Signed   By: Amie Portlandavid  Ormond M.D.   On: 06/25/2014 19:48   Ct Cervical Spine Wo Contrast  06/25/2014   CLINICAL DATA:  Pt tripped down 6 stairs, fell and has laceration to left eye. No LOC. C/o pain to left eye.ETOH positivePatient VERY uncooperative  EXAM: CT HEAD WITHOUT CONTRAST  CT CERVICAL SPINE WITHOUT CONTRAST  TECHNIQUE: Multidetector CT imaging of the head and cervical spine was performed following the standard protocol without intravenous contrast. Multiplanar CT image reconstructions of the cervical spine were also generated.  COMPARISON:  None.  FINDINGS: CT HEAD FINDINGS  Small subdural hemorrhage extends along the anterior left temporal lobe and over the antral lateral left frontal lobe. There is extra-axial air  anterior to the left temporal lobe related to adjacent fractures. Probable minimal subarachnoid hemorrhage also evident along the anterior inferior left temporal lobe.  No other intracranial hemorrhage.  Ventricles normal in size and configuration. No parenchymal masses or mass effect. No evidence of an infarct.  Multiple left-sided facial fractures. There are comminuted fractures of the maxillary sinus involving the anterior lateral walls with fracture displacement and depression. There is a nondisplaced fracture that extends to the left middle cranial fossa floor. Fractures are also breech the lateral  left sphenoid sinus wall. Fractures appear to involve the posterior orbital floor on the left. There is dependent hemorrhage in the left maxillary sinus as well as dependent fluid in the left posterior ethmoid air cell and both sphenoid sinuses. Air extends into the superior orbital fissure on the left and into the infratemporal fossa and middle cranial fossa.  No skull fracture.  CT CERVICAL SPINE FINDINGS  No fracture. No spondylolisthesis. No degenerative changes are evident. No disc herniation. Soft tissues are unremarkable. Lung apices are clear.  IMPRESSION: HEAD CT: Small amount of subdural hemorrhage lies along the anterior inferior margin of the left temporal lobe and antero lateral left frontal lobe. There is some extra-axial air along the left middle cranial fossa adjacent to a nondisplaced middle cranial fossa floor fracture. Probable trace of subarachnoid hemorrhage along the inferior anterior left frontal lobe as well.  No other intracranial hemorrhage. No evidence of parenchymal hemorrhage. No parenchymal masses or mass effect.  Significant left-sided facial fractures. Consider follow-up maxillofacial CT for further evaluation.  CERVICAL CT:  Normal.  No fracture or acute finding.   Electronically Signed   By: Amie Portland M.D.   On: 06/25/2014 19:48     Assessment/Plan: 24 year old female with closed head injury with a very small amount of extra-axial blood in the left temporal tip and left frontal region without mass effect or shift, small dots of pneumocephalus, left facial fractures after a fall down several steps. She should be admitted for observation. I think she could go to a stepdown unit and have neurologic checks every 2 hours. Her neck is nontender. She will likely need clearance with flexion-extension or could be cleared clinically once sober. Would repeat head CT tomorrow.   Rosia Syme S 06/25/2014 9:26 PM

## 2014-06-25 NOTE — ED Notes (Signed)
Per EMS- Pt tripped down 6 stairs, fell and has laceration to left eye. No LOC. C/o pain to left eye. Fully immobilized for precaution. ETOH positive. BP 176/88, HR 80, RR 14.

## 2014-06-26 ENCOUNTER — Observation Stay (HOSPITAL_COMMUNITY): Payer: 59

## 2014-06-26 DIAGNOSIS — M25512 Pain in left shoulder: Secondary | ICD-10-CM | POA: Diagnosis present

## 2014-06-26 DIAGNOSIS — F17210 Nicotine dependence, cigarettes, uncomplicated: Secondary | ICD-10-CM | POA: Diagnosis present

## 2014-06-26 DIAGNOSIS — G9389 Other specified disorders of brain: Secondary | ICD-10-CM | POA: Diagnosis present

## 2014-06-26 DIAGNOSIS — R4182 Altered mental status, unspecified: Secondary | ICD-10-CM | POA: Diagnosis present

## 2014-06-26 DIAGNOSIS — S069X9A Unspecified intracranial injury with loss of consciousness of unspecified duration, initial encounter: Secondary | ICD-10-CM | POA: Diagnosis present

## 2014-06-26 DIAGNOSIS — S02402A Zygomatic fracture, unspecified, initial encounter for closed fracture: Secondary | ICD-10-CM | POA: Diagnosis present

## 2014-06-26 DIAGNOSIS — Z23 Encounter for immunization: Secondary | ICD-10-CM | POA: Diagnosis not present

## 2014-06-26 DIAGNOSIS — S028XXA Fractures of other specified skull and facial bones, initial encounter for closed fracture: Secondary | ICD-10-CM | POA: Diagnosis present

## 2014-06-26 DIAGNOSIS — Y908 Blood alcohol level of 240 mg/100 ml or more: Secondary | ICD-10-CM | POA: Diagnosis present

## 2014-06-26 DIAGNOSIS — F1010 Alcohol abuse, uncomplicated: Secondary | ICD-10-CM | POA: Diagnosis present

## 2014-06-26 DIAGNOSIS — S065X9A Traumatic subdural hemorrhage with loss of consciousness of unspecified duration, initial encounter: Secondary | ICD-10-CM | POA: Diagnosis present

## 2014-06-26 DIAGNOSIS — S069XAA Unspecified intracranial injury with loss of consciousness status unknown, initial encounter: Secondary | ICD-10-CM | POA: Diagnosis present

## 2014-06-26 DIAGNOSIS — W109XXA Fall (on) (from) unspecified stairs and steps, initial encounter: Secondary | ICD-10-CM | POA: Diagnosis present

## 2014-06-26 DIAGNOSIS — S02401A Maxillary fracture, unspecified, initial encounter for closed fracture: Secondary | ICD-10-CM | POA: Diagnosis present

## 2014-06-26 LAB — BASIC METABOLIC PANEL
Anion gap: 6 (ref 5–15)
BUN: 11 mg/dL (ref 6–23)
CALCIUM: 8.3 mg/dL — AB (ref 8.4–10.5)
CHLORIDE: 106 mmol/L (ref 96–112)
CO2: 26 mmol/L (ref 19–32)
Creatinine, Ser: 0.83 mg/dL (ref 0.50–1.10)
GFR calc Af Amer: >90 mL/min (ref >90)
GFR calc non Af Amer: >90 mL/min (ref >90)
GLUCOSE: 110 mg/dL — AB (ref 70–99)
Potassium: 4 mmol/L (ref 3.5–5.1)
SODIUM: 138 mmol/L (ref 135–145)

## 2014-06-26 LAB — CBC
HEMATOCRIT: 39.5 % (ref 36.0–46.0)
Hemoglobin: 13.6 g/dL (ref 12.0–15.0)
MCH: 30.2 pg (ref 26.0–34.0)
MCHC: 34.4 g/dL (ref 30.0–36.0)
MCV: 87.8 fL (ref 78.0–100.0)
Platelets: 153 10*3/uL (ref 150–400)
RBC: 4.5 MIL/uL (ref 3.87–5.11)
RDW: 12.1 % (ref 11.5–15.5)
WBC: 7.1 10*3/uL (ref 4.0–10.5)

## 2014-06-26 LAB — ETHANOL: ALCOHOL ETHYL (B): 93 mg/dL — AB (ref 0–9)

## 2014-06-26 MED ORDER — ACETAMINOPHEN 325 MG PO TABS
650.0000 mg | ORAL_TABLET | Freq: Four times a day (QID) | ORAL | Status: DC | PRN
  Administered 2014-06-26 – 2014-06-27 (×3): 650 mg via ORAL
  Filled 2014-06-26 (×3): qty 2

## 2014-06-26 NOTE — Progress Notes (Signed)
MRSA screening deferred due to multiple facial fractures.

## 2014-06-26 NOTE — Progress Notes (Signed)
Pt admitted to 4N29 from 99M.  Pt reports pain a 1 at present; medicated before transfer.  Alert and oriented.  Oriented to room.  Bed alarm set and call bell within reach.  No family in room at present.  Will continue to monitor.

## 2014-06-26 NOTE — Progress Notes (Signed)
Nutrition Brief Note  Pt qualifies as underweight with current BMI. Per pt she is usually close to 100 lb but has never been higher than 100 lb. Pt denies any recent weight changes and reports good appetite. Pt eats lunch at work, usually a sub and boyfriend cooks dinner. Diet just advanced and pt has ordered her lunch. States she is hungry. Encouraged PO intake for healing.   Wt Readings from Last 15 Encounters:  06/26/14 93 lb 14.7 oz (42.6 kg)  12/12/13 98 lb (44.453 kg)  12/04/13 99 lb 7 oz (45.105 kg)    Body mass index is 16.64 kg/(m^2). Patient meets criteria for underweight based on current BMI.   Current diet order is Regular. Labs and medications reviewed.   No nutrition interventions warranted at this time. If nutrition issues arise, please consult RD.   Kendell BaneHeather Clemon Devaul RD, LDN, CNSC [phone] Pager [phone] After Hours Pager

## 2014-06-26 NOTE — Evaluation (Signed)
Occupational Therapy Evaluation and Discharge Patient Details Name: Rebecca Crosby MRN: [id number] DOB: 03/15/1991 Today's Date: 06/26/2014    History of Present Illness Fall down stairs while intoxicated.Multiple facial fractures SDH without pressure or herniation Left periorbital laceration. Remembers talking with another couple about going out to eat and then the next thing she remembers is waking up here in hospital   Clinical Impression   This 24 yo female admitted with above presents to acute OT at an Independent level for BADLs. No further OT needs, we will sign off.    Follow Up Recommendations  No OT follow up    Equipment Recommendations  None recommended by OT       Precautions / Restrictions Precautions Precautions: None Restrictions Weight Bearing Restrictions: No      Mobility Bed Mobility Overal bed mobility: Independent                Transfers Overall transfer level: Independent                         ADL Overall ADL's : Independent                                             Vision  Pt reports vision in left eye a little blurry, but more than likely due to swelling from fall.                          Pertinent Vitals/Pain Pain Assessment: No/denies pain     Hand Dominance Left   Extremity/Trunk Assessment Upper Extremity Assessment Upper Extremity Assessment: LUE deficits/detail LUE Deficits / Details: pain with left shoulder movement--encouraged her to keep moving and using it--but may want to avoid heavy pushing, pulling, lifting LUE Coordination: decreased gross motor           Communication Communication Communication: No difficulties   Cognition Arousal/Alertness: Awake/alert Behavior During Therapy: WFL for tasks assessed/performed Overall Cognitive Status:  (gave pt handout on mild tbi/concussion from Exit care)                                Home Living Family/patient  expects to be discharged to:: Private residence Living Arrangements:  (boyfriend and his mother) Available Help at Discharge: Family;Available 24 hours/day Type of Home: House             Bathroom Shower/Tub: Tub/shower unit;Curtain Shower/tub characteristics: Engineer, building servicesCurtain Bathroom Toilet: Standard     Home Equipment: None   Additional Comments: Works as an Airline pilotassistant supervisior at [employer]      Prior Functioning/Environment Level of Independence: Independent             OT Diagnosis: Generalized weakness         OT Goals(Current goals can be found in the care plan section) Acute Rehab OT Goals Patient Stated Goal: to be able to go back to work in a week  OT Frequency:                End of Session Equipment Utilized During Treatment:  (none) Nurse Communication:  (Pt did fine with OT, no further skilled needs noted)  Activity Tolerance: Patient tolerated treatment well Patient left: in bed;with call bell/phone within reach   Time: [phone]  OT Time Calculation (min): 33 min Charges:  OT General Charges $OT Visit: 1 Procedure OT Evaluation $Initial OT Evaluation Tier I: 1 Procedure OT Treatments $Self Care/Home Management : 8-22 mins  Evette Georges [phone] 06/26/2014, 3:38 PM

## 2014-06-26 NOTE — Progress Notes (Signed)
Patient states she is doing better today. Some left facial swelling and pain. Mild headache. No numbness tingling or weakness. She is awake and alert and conversant. No focal motor or sensory deficit. As for her to floor. Okay for discharge home from my standpoint with follow-up in 2 weeks.

## 2014-06-26 NOTE — Clinical Social Work Note (Signed)
Clinical Social Work Department BRIEF PSYCHOSOCIAL ASSESSMENT 06/26/2014  Patient:  Rebecca Crosby, Rebecca Crosby     Account Number:  [id number]     Admit date:  06/25/2014  Clinical Social Worker:  Myles Lipps  Date/Time:  06/26/2014 04:00 PM  Referred by:  Physician  Date Referred:  06/26/2014 Referred for  Substance Abuse   Other Referral:   Interview type:  Patient Other interview type:   No family/friends at bedside    PSYCHOSOCIAL DATA Living Status:  SIGNIFICANT OTHER Admitted from facility:   Level of care:   Primary support name:  Herbert Seta [phone] Primary support relationship to patient:  PARENT Degree of support available:   Strong    CURRENT CONCERNS Current Concerns  Substance Abuse  None Noted   Other Concerns:    SOCIAL WORK ASSESSMENT / PLAN Clinical Social Worker met with patient at bedside to offer support and discuss patient current substance use.  Patient states that her and her boyfriend went with another couple to [facility] for dinner and drinks to watch the football game this weekend.  Patient has no recollection of anything that occurred after about 14:00 on the day of her fall but is able to recall the story that has been told to her. Patient states that she was leaving the restaurant and walking to the car when she slipped on the ice and fell down several stairs.  Patient states that a Engineer, structural was also eating at the restaurant and was the one who called 911.  Patient states that she lives at home wtih her boyfriend and her boyfriends mother who are available to assist her 24/7 as needed at discharge.    Clinical Social Worker inquired about current substance use.  Patient states that she enjoys going out and having a few drinks on the weekends.  Patient works full time at [employer] and does not have concerns regarding chronic and/or excessive use.  SBIRT completed and no resources provided at this time.  CSW signing off.  Please reconsult if  further needs arise prior to discharge.   Assessment/plan status:  No Further Intervention Required Other assessment/ plan:   Information/referral to community resources:   SBIRT completed.  Patient declined all resources prior to discharge.    PATIENT'S/FAMILY'S RESPONSE TO PLAN OF CARE: Patient alert and oriented x3 laying in bed.  Patient with good family support at home and in the area to assist as needed.  Patient was very engaged in assessment process and remorseful for the incident leading to hospitalization. Patient verbalized understanding of CSW role and appreciation for support and concern.

## 2014-06-26 NOTE — Progress Notes (Signed)
Patient ID: Rebecca Crosby, female   DOB: 08/28/1990, 24 y.o.   MRN: [id number]    Subjective: Some HA and L shoulder pain  Objective: Vital signs in last 24 hours: Temp:  [97.4 F (36.3 C)-99 F (37.2 C)] 99 F (37.2 C) (01/25 0400) Pulse Rate:  [57-99] 57 (01/25 0700) Resp:  [10-29] 20 (01/25 0700) BP: (89-116)/(46-85) 100/53 mmHg (01/25 0700) SpO2:  [93 %-100 %] 96 % (01/25 0700) Weight:  [93 lb 14.7 oz (42.6 kg)] 93 lb 14.7 oz (42.6 kg) (01/25 0100)    Intake/Output from previous day: 01/24 0701 - 01/25 0700 In: 750 [I.V.:700; IV Piggyback:50] Out: -  Intake/Output this shift:    General appearance: no distress Head: L facial ecchymoses and edema, lac lat to L eye closure intact Eyes: PERL, a lot of L lid edema Neck: no post midline TTP, no pain on AROM, collar removed Resp: clear to auscultation bilaterally Cardio: regular rate and rhythm GI: soft, NT, ND Extremities: mild muscular tenderness L shoulder but good ROM and no bony deform Neuro: A&O, amnestic to event, PERL, MAE well  Lab Results: CBC   Recent Labs  06/25/14 2021 06/26/14 0240  WBC 7.5 7.1  HGB 15.1* 13.6  HCT 43.2 39.5  PLT 147* 153   BMET  Recent Labs  06/25/14 2021 06/26/14 0240  NA 137 138  K 4.2 4.0  CL 101 106  CO2 24 26  GLUCOSE 100* 110*  BUN 14 11  CREATININE 0.87 0.83  CALCIUM 9.0 8.3*   PT/INR No results for input(s): LABPROT, INR in the last 72 hours. ABG No results for input(s): PHART, HCO3 in the last 72 hours.  Invalid input(s): PCO2, PO2  Studies/Results: Ct Head Wo Contrast  06/26/2014   CLINICAL DATA:  Patient fell down steps last night. Follow-up subdural hematoma. Left-sided headache.  EXAM: CT HEAD WITHOUT CONTRAST  TECHNIQUE: Contiguous axial images were obtained from the base of the skull through the vertex without intravenous contrast.  COMPARISON:  06/25/2014  FINDINGS: Tiny subdural hematoma seen previously along the left anterior frontotal region is less  well demonstrated on the current study. Minimal residual subdural hematoma demonstrated in the anterior middle cranial fossa with associated gas collections, stable since previous study. Left sphenoid, temporal, and maxillary antral fractures again demonstrated. Air-fluid levels in the sphenoid sinus with opacification of left maxillary antrum and ethmoid air cells. No progression. No mass effect or midline shift. No evidence of subarachnoid or intraventricular hematoma.  IMPRESSION: Re identification of subdural hematoma in the left anterior temporal region with less distinct subdural hematoma in the frontal region. No progression. Re identification of left facial and skullbase fractures. No significant change since previous study.   Electronically Signed   By: Burman NievesWilliam  Stevens M.D.   On: 06/26/2014 06:57   Ct Head Wo Contrast  06/25/2014   CLINICAL DATA:  Pt tripped down 6 stairs, fell and has laceration to left eye. No LOC. C/o pain to left eye.ETOH positivePatient VERY uncooperative  EXAM: CT HEAD WITHOUT CONTRAST  CT CERVICAL SPINE WITHOUT CONTRAST  TECHNIQUE: Multidetector CT imaging of the head and cervical spine was performed following the standard protocol without intravenous contrast. Multiplanar CT image reconstructions of the cervical spine were also generated.  COMPARISON:  None.  FINDINGS: CT HEAD FINDINGS  Small subdural hemorrhage extends along the anterior left temporal lobe and over the antral lateral left frontal lobe. There is extra-axial air anterior to the left temporal lobe related to adjacent fractures.  Probable minimal subarachnoid hemorrhage also evident along the anterior inferior left temporal lobe.  No other intracranial hemorrhage.  Ventricles normal in size and configuration. No parenchymal masses or mass effect. No evidence of an infarct.  Multiple left-sided facial fractures. There are comminuted fractures of the maxillary sinus involving the anterior lateral walls with fracture  displacement and depression. There is a nondisplaced fracture that extends to the left middle cranial fossa floor. Fractures are also breech the lateral left sphenoid sinus wall. Fractures appear to involve the posterior orbital floor on the left. There is dependent hemorrhage in the left maxillary sinus as well as dependent fluid in the left posterior ethmoid air cell and both sphenoid sinuses. Air extends into the superior orbital fissure on the left and into the infratemporal fossa and middle cranial fossa.  No skull fracture.  CT CERVICAL SPINE FINDINGS  No fracture. No spondylolisthesis. No degenerative changes are evident. No disc herniation. Soft tissues are unremarkable. Lung apices are clear.  IMPRESSION: HEAD CT: Small amount of subdural hemorrhage lies along the anterior inferior margin of the left temporal lobe and antero lateral left frontal lobe. There is some extra-axial air along the left middle cranial fossa adjacent to a nondisplaced middle cranial fossa floor fracture. Probable trace of subarachnoid hemorrhage along the inferior anterior left frontal lobe as well.  No other intracranial hemorrhage. No evidence of parenchymal hemorrhage. No parenchymal masses or mass effect.  Significant left-sided facial fractures. Consider follow-up maxillofacial CT for further evaluation.  CERVICAL CT:  Normal.  No fracture or acute finding.   Electronically Signed   By: Amie Portland M.D.   On: 06/25/2014 19:48   Ct Cervical Spine Wo Contrast  06/25/2014   CLINICAL DATA:  Pt tripped down 6 stairs, fell and has laceration to left eye. No LOC. C/o pain to left eye.ETOH positivePatient VERY uncooperative  EXAM: CT HEAD WITHOUT CONTRAST  CT CERVICAL SPINE WITHOUT CONTRAST  TECHNIQUE: Multidetector CT imaging of the head and cervical spine was performed following the standard protocol without intravenous contrast. Multiplanar CT image reconstructions of the cervical spine were also generated.  COMPARISON:  None.   FINDINGS: CT HEAD FINDINGS  Small subdural hemorrhage extends along the anterior left temporal lobe and over the antral lateral left frontal lobe. There is extra-axial air anterior to the left temporal lobe related to adjacent fractures. Probable minimal subarachnoid hemorrhage also evident along the anterior inferior left temporal lobe.  No other intracranial hemorrhage.  Ventricles normal in size and configuration. No parenchymal masses or mass effect. No evidence of an infarct.  Multiple left-sided facial fractures. There are comminuted fractures of the maxillary sinus involving the anterior lateral walls with fracture displacement and depression. There is a nondisplaced fracture that extends to the left middle cranial fossa floor. Fractures are also breech the lateral left sphenoid sinus wall. Fractures appear to involve the posterior orbital floor on the left. There is dependent hemorrhage in the left maxillary sinus as well as dependent fluid in the left posterior ethmoid air cell and both sphenoid sinuses. Air extends into the superior orbital fissure on the left and into the infratemporal fossa and middle cranial fossa.  No skull fracture.  CT CERVICAL SPINE FINDINGS  No fracture. No spondylolisthesis. No degenerative changes are evident. No disc herniation. Soft tissues are unremarkable. Lung apices are clear.  IMPRESSION: HEAD CT: Small amount of subdural hemorrhage lies along the anterior inferior margin of the left temporal lobe and antero lateral left frontal lobe.  There is some extra-axial air along the left middle cranial fossa adjacent to a nondisplaced middle cranial fossa floor fracture. Probable trace of subarachnoid hemorrhage along the inferior anterior left frontal lobe as well.  No other intracranial hemorrhage. No evidence of parenchymal hemorrhage. No parenchymal masses or mass effect.  Significant left-sided facial fractures. Consider follow-up maxillofacial CT for further evaluation.   CERVICAL CT:  Normal.  No fracture or acute finding.   Electronically Signed   By: Amie Portland M.D.   On: 06/25/2014 19:48   Ct Maxillofacial Wo Cm  06/25/2014   CLINICAL DATA:  Acute traumatic injury with middle cranial fossa fractures and left facial fractures. Initial encounter.  EXAM: CT MAXILLOFACIAL WITHOUT CONTRAST  TECHNIQUE: Multidetector CT imaging of the maxillofacial structures was performed. Multiplanar CT image reconstructions were also generated. A small metallic BB was placed on the right temple in order to reliably differentiate right from left.  COMPARISON:  Prior head CT performed earlier on the same day.  FINDINGS: Again seen is an acute nondisplaced fracture extending through the floor of the left middle cranial fossa (series 202, image 30). Scattered pneumocephalus with subjacent subdural and probable subarachnoid hemorrhage again seen. There is a fracture medially involving the left optic canal towards the left orbital apex, extending inferiorly towards the left pterygopalatine fossa. There is an acute nondisplaced fracture through the lateral wall of the left sphenoid sinus, with additional subtle acute nondisplaced fracture through the posterior wall of the right sphenoid sinus. Layering blood present within the sphenoid sinuses. No involvement of the carotid canals. Small amount of gas present within the sella.  Additional acute nondisplaced fracture through the posterior aspect of the left orbital floor, best appreciated on sagittal sequence (series 2010, image 52). Small amount a gas present within knee superior aspect of the bony left orbit.  There are acute comminuted mildly displaced fractures through the anterior and posterior walls the left maxillary sinus. Layering blood present within the left maxillary sinus. Acute nondisplaced fracture extends through the posterior margin of the left zygomatic arch into the left temporomandibular fossa (series 202, image 34). The left  mandibular condyle intact and normally positioned.  There is a subtle acute nondisplaced fracture through the lateral limb of the left pterygoid plate.  Mandible is intact.  No right-sided maxillofacial fractures.  Swelling with soft tissue emphysema present within the left face and left infratemporal fossa.  The extraocular muscles and optic nerves are symmetric and within normal limits.  Visualized mastoid air cells clear as are the middle ear cavities.  IMPRESSION: 1. Acute nondisplaced fracture through the anterior floor of the left middle cranial fossa with medial extension into the left optic canal and left sphenoid sinus. No involvement of the carotid canals is identified. Subdural and subarachnoid hemorrhage with pneumocephalus within the left middle cranial fossa as before. 2. Acute nondisplaced fracture through the posterior floor of the bony left orbit without displacement. There is slight left-sided proptosis without frank retro-orbital hematoma. The globes are intact. 3. Acute comminuted fractures through the anterior and posterior walls of the left maxillary sinus. 4. Acute nondisplaced fracture through the posterior left zygomatic arch. 5. Acute nondisplaced fracture through the lateral limb of the left pterygoid plate. 6. Hemo sinus within the sphenoid and left maxillary sinus. 7. Left facial and periorbital contusion with emphysema.   Electronically Signed   By: Rise Mu M.D.   On: 06/25/2014 21:57    Anti-infectives: Anti-infectives    Start  Dose/Rate Route Frequency Ordered Stop   06/25/14 2051  ceFAZolin (ANCEF) 2-3 GM-% IVPB SOLR  Status:  Discontinued    Comments:  Duncan Dull   : cabinet override      06/25/14 2051 06/25/14 2136   06/25/14 2015  ceFAZolin (ANCEF) IVPB 1 g/50 mL premix     1 g100 mL/hr over 30 Minutes Intravenous  Once 06/25/14 2010 06/25/14 2123      Assessment/Plan: Fall down stairs TBI/L temp and frontal SDH/L skull base FX - F/U CT this  AM stable, start TBI team therapies L orbit/max sinus/zygoma/pterygoid plate FXs - Dr. Lazarus Salines to see this AM ID - received Ancef 1 dose C spine cleared L shoulder pain - seems to be muscular strain ETOH abuse - CSW to see for SBIRT FEN - clears Dispo - therapies    LOS: 1 day    Violeta Gelinas, MD, MPH, FACS Trauma: [phone] General Surgery: [phone]  06/26/2014

## 2014-06-27 DIAGNOSIS — W19XXXA Unspecified fall, initial encounter: Secondary | ICD-10-CM | POA: Diagnosis present

## 2014-06-27 DIAGNOSIS — S0181XA Laceration without foreign body of other part of head, initial encounter: Secondary | ICD-10-CM | POA: Diagnosis present

## 2014-06-27 DIAGNOSIS — S0292XA Unspecified fracture of facial bones, initial encounter for closed fracture: Secondary | ICD-10-CM | POA: Diagnosis present

## 2014-06-27 LAB — CBC
HEMATOCRIT: 38.9 % (ref 36.0–46.0)
HEMOGLOBIN: 13.1 g/dL (ref 12.0–15.0)
MCH: 30 pg (ref 26.0–34.0)
MCHC: 33.7 g/dL (ref 30.0–36.0)
MCV: 89.2 fL (ref 78.0–100.0)
Platelets: 126 10*3/uL — ABNORMAL LOW (ref 150–400)
RBC: 4.36 MIL/uL (ref 3.87–5.11)
RDW: 12.2 % (ref 11.5–15.5)
WBC: 4.7 10*3/uL (ref 4.0–10.5)

## 2014-06-27 LAB — BASIC METABOLIC PANEL
Anion gap: 11 (ref 5–15)
BUN: 7 mg/dL (ref 6–23)
CO2: 20 mmol/L (ref 19–32)
Calcium: 8.8 mg/dL (ref 8.4–10.5)
Chloride: 106 mmol/L (ref 96–112)
Creatinine, Ser: 0.81 mg/dL (ref 0.50–1.10)
GFR calc Af Amer: >90 mL/min (ref >90)
Glucose, Bld: 99 mg/dL (ref 70–99)
Potassium: 4.3 mmol/L (ref 3.5–5.1)
Sodium: 137 mmol/L (ref 135–145)

## 2014-06-27 MED ORDER — CEPHALEXIN 250 MG PO CAPS: 250.0000 mg | ORAL_CAPSULE | Freq: Four times a day (QID) | ORAL | Status: DC

## 2014-06-27 MED ORDER — OXYCODONE-ACETAMINOPHEN 5-325 MG PO TABS: ORAL_TABLET | ORAL | Status: DC

## 2014-06-27 NOTE — Clinical Documentation Improvement (Signed)
Supporting Information:  Labs: 06/25/14: Lactic acid: 3.3.   Possible Clinical Condition: . Bacteremia (positive blood cultures only) . Specify sepsis with UTI, versus UTI only . Sepsis-specify causative organism if known . Sepsis due to: --Device --Implant --Graft --Infusion --Abortion . Severe sepsis-sepsis with organ dysfunction --Specify organ dysfunction Respiratory failure Encephalopathy Acute kidney failure Other (specify) . SIRS (Systemic Inflammatory Response Syndrome --With or without organ dysfunction . Document septic shock if present . Document any associated diagnoses/conditions    Thank Gabriel CirriYou,  Brieanna Nau Mathews-Bethea,RN,BSN,Clinical Documentation Specialist [phone] [email or web address]

## 2014-06-27 NOTE — Evaluation (Signed)
Physical Therapy Evaluation and discharge Patient Details Name: Rebecca Crosby MRN: [id number] DOB: 1990-09-15 Today's Date: 06/27/2014   History of Present Illness  Fall down stairs while intoxicated.Multiple facial fractures SDH without pressure or herniation Left periorbital laceration. Remembers talking with another couple about going out to eat and then the next thing she remembers is waking up here in hospital  Clinical Impression  Pt ambulating at MOD I level with no LOB with no AD.  She was able to perform head turns, quick start/stops, increased gait speed, backwards gait, stairs, and picking up item off floor with no difficulty.  No skilled PT needs identified and okayed boyfriend to walk with patient around hallway. Will d/c from skilled PT.    Follow Up Recommendations No PT follow up    Equipment Recommendations  None recommended by PT    Recommendations for Other Services       Precautions / Restrictions Precautions Precautions: None Restrictions Weight Bearing Restrictions: No      Mobility  Bed Mobility Overal bed mobility: Independent                Transfers Overall transfer level: Independent                  Ambulation/Gait Ambulation/Gait assistance: Modified independent (Device/Increase time) Ambulation Distance (Feet): 1020 Feet Assistive device: None Gait Pattern/deviations: WFL(Within Functional Limits)     General Gait Details: Pt ambulated > 1000' with starts/stops, head turns, quick turns, backwards gait with no LOB  Stairs Stairs: Yes Stairs assistance: Modified independent (Device/Increase time) Stair Management: No rails Number of Stairs: 4 General stair comments: step through pattern on all steps but one. No rails and no LOB  Wheelchair Mobility    Modified Rankin (Stroke Patients Only)       Balance Overall balance assessment: No apparent balance deficits (not formally assessed)                                Standardized Balance Assessment Standardized Balance Assessment : Dynamic Gait Index   Dynamic Gait Index Level Surface: Normal Change in Gait Speed: Normal Gait with Horizontal Head Turns: Normal Gait with Vertical Head Turns: Normal Gait and Pivot Turn: Normal Step Over Obstacle: Normal Step Around Obstacles: Normal Steps: Mild Impairment Total Score: 23       Pertinent Vitals/Pain Pain Assessment: 0-10 Pain Score: 4  Pain Location: L shld and face 5-6/10 Pain Descriptors / Indicators: Aching;Sore Pain Intervention(s): Monitored during session    Home Living Family/patient expects to be discharged to:: Private residence Living Arrangements: Non-relatives/Friends Available Help at Discharge: Family;Available 24 hours/day Type of Home: House         Home Equipment: None Additional Comments: Works as an Airline pilot at [employer]    Prior Function Level of Independence: Independent               Hand Dominance   Dominant Hand: Left    Extremity/Trunk Assessment   Upper Extremity Assessment: Defer to OT evaluation           Lower Extremity Assessment: Overall WFL for tasks assessed      Cervical / Trunk Assessment: Normal  Communication   Communication: No difficulties  Cognition Arousal/Alertness: Awake/alert Behavior During Therapy: WFL for tasks assessed/performed Overall Cognitive Status: Within Functional Limits for tasks assessed  General Comments      Exercises        Assessment/Plan    PT Assessment Patent does not need any further PT services  PT Diagnosis Difficulty walking   PT Problem List    PT Treatment Interventions     PT Goals (Current goals can be found in the Care Plan section) Acute Rehab PT Goals PT Goal Formulation: All assessment and education complete, DC therapy    Frequency     Barriers to discharge        Co-evaluation               End of  Session Equipment Utilized During Treatment: Gait belt Activity Tolerance: Patient tolerated treatment well Patient left: in chair;with family/visitor present Nurse Communication: Mobility status;Other (comment) (Ok for pt to ambulate with boyfriend)         Time: 0935-1000 PT Time Calculation (min) (ACUTE ONLY): 25 min   Charges:   PT Evaluation $Initial PT Evaluation Tier I: 1 Procedure PT Treatments $Gait Training: 8-22 mins   PT G Codes:        Froylan Hobby LUBECK 06/27/2014, 11:02 AM

## 2014-06-27 NOTE — Discharge Instructions (Signed)
No driving while taking oxycodone.  No nose blowing x 2 weeks.

## 2014-06-27 NOTE — Discharge Summary (Signed)
Physician Discharge Summary  Patient ID: Rebecca Crosby MRN: [id number] DOB/AGE: 26/10/1990 23 y.o.  Admit date: 06/25/2014 Discharge date: 06/27/2014  Discharge Diagnoses Patient Active Problem List   Diagnosis Date Noted  . Closed fracture of facial bones 06/27/2014  . Facial laceration 06/27/2014  . Fall 06/27/2014  . TBI (traumatic brain injury) 06/26/2014  . Alcohol intoxication 06/25/2014    Consultants Dr. Marikay Alaravid Jones for neurosurgery   Procedures 1/24 -- Closure of facial laceration by Dr. Bethann BerkshireAaron Schmitt   HPI: Rebecca Crosby was drinking when she fell down approximately 6-18 stairs.She then went shopping at [employer].While at [employer] she became belligerent and was sent to the ED for evaluation.Her evaluation included CT scans of the head, face, and cervical spine and showed the brain injury and facial fractures. She was admitted to the trauma service and neurosurgery was consulted.   Hospital Course: Neurosurgery recommended non-operative treatment of the brain injury with a repeat CT scan the next day. This was stable and she remained alert and appropriate throughout her stay. Through some miscommunication ENT was never consulted. They were called when no one saw her the day after admission. He reviewed the CT scans and said she could see him as an outpatient if necessary. He did recommend ophthalmology evaluation which was arranged at discharge. She was evaluated by the traumatic brain injury therapy team and was found to have no needs. She was discharged home in good condition.     Medication List    TAKE these medications        cephALEXin 250 MG capsule  Commonly known as:  KEFLEX  Take 1 capsule (250 mg total) by mouth 4 (four) times daily.     ibuprofen 200 MG tablet  Commonly known as:  ADVIL,MOTRIN  Take 800 mg by mouth every 6 (six) hours as needed for pain.     oxyCODONE-acetaminophen 5-325 MG per tablet  Commonly known as:  PERCOCET  1-2 tabs po q6 hours prn  pain            Follow-up Information    Follow up with JONES,DAVID S, MD. Schedule an appointment as soon as possible for a visit in 2 weeks.   Specialty:  Neurosurgery   Contact information:   [address] [place] [address] [phone]       Follow up with Leone HavenBEVIS,TIMOTHY R., MD.   Specialty:  Ophthalmology   Why:  Go to office after discharge   Contact information:   [address] [place] [address] [phone]       Follow up with [facility] On 07/05/2014.   Why:  2:15 for suture removal   Contact information:   [address] [place] [phone]       Signed: Freeman CaldronMichael J. Johnnetta Holstine, PA-C Pager: [phone] General Trauma PA Pager: [phone] 06/27/2014, 2:48 PM

## 2014-06-27 NOTE — Progress Notes (Signed)
Discharge orders received, pt for discharge home today, IV D/C, L orbital laceration intact with sutures to be taken out 07/05/14, pt is aware of appointment.  D/C instructions and Rx given with verbalized understanding.  Family at bedside to assist pt with discharge. Staff brought pt downstairs via wheelchair.

## 2014-06-27 NOTE — Evaluation (Signed)
Speech Language Pathology Evaluation Patient Details Name: Rebecca Crosby MRN: [id number] DOB: 11/06/1990 Today's Date: 06/27/2014 Time: [id number] SLP Time Calculation (min) (ACUTE ONLY): 36 min  Problem List:  Patient Active Problem List   Diagnosis Date Noted  . TBI (traumatic brain injury) 06/26/2014  . Alcohol intoxication 06/25/2014   Past Medical History:  Past Medical History  Diagnosis Date  . Metacarpal bone fracture 12/03/2013    right ring  . History of anemia 2008  . History of kidney stones    Past Surgical History:  Past Surgical History  Procedure Laterality Date  . No past surgeries    . Open reduction internal fixation (orif) metacarpal Right 12/12/2013    Procedure: OPEN REDUCTION INTERNAL FIXATION (ORIF) RIGHT RING FINGER METACARPAL;  Surgeon: Tami RibasKevin R Kuzma, MD;  Location: [facility];  Service: Orthopedics;  Laterality: Right;   HPI:  Fall down stairs while intoxicated.Multiple facial fractures SDH without pressure or herniation Left periorbital laceration. Remembers talking with another couple about going out to eat and then the next thing she remembers is waking up here in hospital    Assessment / Plan / Recommendation Clinical Impression  Pt appears to be functioning near her cognitive-linguistic baseline, with pt and boyfriend present in agreement. Pt recalled several side effects of concussion from education provided by OT on previous date with Mod I, and SLP provided supplemental education as well. Pt and her boyfriend verbalized their understanding.    SLP Assessment  Patient does not need any further Speech Lanaguage Pathology Services    Follow Up Recommendations  Other (comment) (OP SLP should she have difficulty with complex tasks at home)    Pertinent Vitals/Pain Pain Assessment: No/denies pain Pain Score: 4  Pain Location: L shld and face 5-6/10 Pain Descriptors / Indicators: Aching;Sore Pain Intervention(s): Monitored during  session   SLP Goals  Patient/Family Stated Goal: they want to know about going home  SLP Evaluation Prior Functioning  Cognitive/Linguistic Baseline: Within functional limits Type of Home: House  Lives With: Significant other;Other (Comment) (boyfriend and his mother) Available Help at Discharge: Family;Available 24 hours/day Vocation: Full time employment   Cognition  Overall Cognitive Status: Within Functional Limits for tasks assessed    Comprehension  Auditory Comprehension Overall Auditory Comprehension: Appears within functional limits for tasks assessed Visual Recognition/Discrimination Discrimination: Within Function Limits Reading Comprehension Reading Status: Within funtional limits    Expression Expression Primary Mode of Expression: Verbal Verbal Expression Overall Verbal Expression: Appears within functional limits for tasks assessed Written Expression Dominant Hand: Left Written Expression: Not tested   Oral / Motor Oral Motor/Sensory Function Overall Oral Motor/Sensory Function: Appears within functional limits for tasks assessed Motor Speech Overall Motor Speech: Appears within functional limits for tasks assessed   GO     Maxcine HamLaura Paiewonsky, M.A. CCC-SLP [phone]  Maxcine Hamaiewonsky, Isidor Bromell 06/27/2014, 1:29 PM

## 2014-06-27 NOTE — Progress Notes (Signed)
Patient ID: Rebecca Crosby, female   DOB: 12/23/1990, 24 y.o.   MRN: [id number]   LOS: 2 days   Subjective: Doing well, no dizziness when she's up. Minimal pain.   Objective: Vital signs in last 24 hours: Temp:  [98 F (36.7 C)-98.8 F (37.1 C)] 98.8 F (37.1 C) (01/26 0655) Pulse Rate:  [45-70] 64 (01/26 0655) Resp:  [11-18] 18 (01/26 0655) BP: (76-120)/(55-93) 120/76 mmHg (01/26 0655) SpO2:  [96 %-100 %] 98 % (01/26 0655)    Laboratory  CBC  Recent Labs  06/26/14 0240 06/27/14 0540  WBC 7.1 4.7  HGB 13.6 13.1  HCT 39.5 38.9  PLT 153 126*   BMET  Recent Labs  06/26/14 0240 06/27/14 0540  NA 138 137  K 4.0 4.3  CL 106 106  CO2 26 20  GLUCOSE 110* 99  BUN 11 7  CREATININE 0.83 0.81  CALCIUM 8.3* 8.8    Physical Exam General appearance: alert and no distress Resp: clear to auscultation bilaterally Cardio: regular rate and rhythm GI: normal findings: bowel sounds normal and soft, non-tender   Assessment/Plan: Fall down stairs 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 Lazarus SalinesWolicki about pt L shoulder pain - seems to be muscular strain ETOH abuse - CSW to see for SBIRT FEN - No issues Dispo - Likely home after PT/ST evals    Freeman CaldronMichael J. Anush Wiedeman, PA-C Pager: [phone] General Trauma PA Pager: [phone]  06/27/2014

## 2015-05-03 LAB — OB RESULTS CONSOLE RUBELLA ANTIBODY, IGM: RUBELLA: IMMUNE

## 2015-05-03 LAB — OB RESULTS CONSOLE ABO/RH: RH Type: POSITIVE

## 2015-05-03 LAB — OB RESULTS CONSOLE HIV ANTIBODY (ROUTINE TESTING): HIV: NONREACTIVE

## 2015-05-03 LAB — OB RESULTS CONSOLE GC/CHLAMYDIA
CHLAMYDIA, DNA PROBE: NEGATIVE
GC PROBE AMP, GENITAL: NEGATIVE

## 2015-05-03 LAB — OB RESULTS CONSOLE HEPATITIS B SURFACE ANTIGEN: HEP B S AG: NEGATIVE

## 2015-06-03 NOTE — L&D Delivery Note (Signed)
Patient was C/C/+2 and pushed for 40 minutes with epidural.   NSVD  female infant, Apgars 9,9, weight P.   The patient had no lacerations. Fundus was firm. EBL was expected amount. Placenta was delivered intact. Vagina was clear.  Baby was vigorous and doing skin to skin with mother.  Alashia Brownfield A

## 2015-09-20 LAB — OB RESULTS CONSOLE GBS: STREP GROUP B AG: NEGATIVE

## 2015-10-25 ENCOUNTER — Encounter (HOSPITAL_COMMUNITY): Payer: Self-pay | Admitting: *Deleted

## 2015-10-25 ENCOUNTER — Inpatient Hospital Stay (HOSPITAL_COMMUNITY): Payer: 59 | Admitting: Anesthesiology

## 2015-10-25 ENCOUNTER — Inpatient Hospital Stay (HOSPITAL_COMMUNITY)
Admission: AD | Admit: 2015-10-25 | Discharge: 2015-10-27 | DRG: 775 | Disposition: A | Discharge status: Home / Self Care | Payer: 59 | Source: Ambulatory Visit | Attending: Obstetrics and Gynecology | Admitting: Obstetrics and Gynecology

## 2015-10-25 DIAGNOSIS — K219 Gastro-esophageal reflux disease without esophagitis: Secondary | ICD-10-CM | POA: Diagnosis present

## 2015-10-25 DIAGNOSIS — Z87442 Personal history of urinary calculi: Secondary | ICD-10-CM

## 2015-10-25 DIAGNOSIS — Z87891 Personal history of nicotine dependence: Secondary | ICD-10-CM

## 2015-10-25 DIAGNOSIS — Z3A40 40 weeks gestation of pregnancy: Secondary | ICD-10-CM

## 2015-10-25 DIAGNOSIS — O9962 Diseases of the digestive system complicating childbirth: Secondary | ICD-10-CM | POA: Diagnosis present

## 2015-10-25 DIAGNOSIS — Z3490 Encounter for supervision of normal pregnancy, unspecified, unspecified trimester: Secondary | ICD-10-CM

## 2015-10-25 LAB — TYPE AND SCREEN
ABO/RH(D): B POS
Antibody Screen: NEGATIVE

## 2015-10-25 LAB — CBC
HEMATOCRIT: 33.1 % — AB (ref 36.0–46.0)
HEMOGLOBIN: 11.3 g/dL — AB (ref 12.0–15.0)
MCH: 28.5 pg (ref 26.0–34.0)
MCHC: 34.1 g/dL (ref 30.0–36.0)
MCV: 83.6 fL (ref 78.0–100.0)
Platelets: 179 10*3/uL (ref 150–400)
RBC: 3.96 MIL/uL (ref 3.87–5.11)
RDW: 13.7 % (ref 11.5–15.5)
WBC: 9.6 10*3/uL (ref 4.0–10.5)

## 2015-10-25 LAB — ABO/RH: ABO/RH(D): B POS

## 2015-10-25 MED ORDER — ONDANSETRON HCL 4 MG/2ML IJ SOLN
4.0000 mg | Freq: Four times a day (QID) | INTRAMUSCULAR | Status: DC | PRN
  Administered 2015-10-25: 4 mg via INTRAVENOUS
  Filled 2015-10-25: qty 2

## 2015-10-25 MED ORDER — LIDOCAINE HCL (PF) 1 % IJ SOLN
INTRAMUSCULAR | Status: DC | PRN
  Administered 2015-10-25: 4 mL via EPIDURAL

## 2015-10-25 MED ORDER — EPHEDRINE 5 MG/ML INJ: 10.0000 mg | INTRAVENOUS | Status: DC | PRN

## 2015-10-25 MED ORDER — PHENYLEPHRINE 40 MCG/ML (10ML) SYRINGE FOR IV PUSH (FOR BLOOD PRESSURE SUPPORT): 80.0000 ug | PREFILLED_SYRINGE | INTRAVENOUS | Status: DC | PRN

## 2015-10-25 MED ORDER — FENTANYL 2.5 MCG/ML BUPIVACAINE 1/10 % EPIDURAL INFUSION (WH - ANES)
14.0000 mL/h | INTRAMUSCULAR | Status: DC | PRN
  Administered 2015-10-25: 13 mL/h via EPIDURAL
  Administered 2015-10-25: 14 mL/h via EPIDURAL
  Filled 2015-10-25 (×2): qty 125

## 2015-10-25 MED ORDER — DIPHENHYDRAMINE HCL 50 MG/ML IJ SOLN: 12.5000 mg | INTRAMUSCULAR | Status: DC | PRN

## 2015-10-25 MED ORDER — LIDOCAINE HCL (PF) 1 % IJ SOLN
30.0000 mL | INTRAMUSCULAR | Status: DC | PRN
  Filled 2015-10-25: qty 30

## 2015-10-25 MED ORDER — ACETAMINOPHEN 325 MG PO TABS: 650.0000 mg | ORAL_TABLET | ORAL | Status: DC | PRN

## 2015-10-25 MED ORDER — LACTATED RINGERS IV SOLN: 500.0000 mL | Freq: Once | INTRAVENOUS | Status: DC

## 2015-10-25 MED ORDER — SOD CITRATE-CITRIC ACID 500-334 MG/5ML PO SOLN
30.0000 mL | ORAL | Status: DC | PRN
  Administered 2015-10-25: 30 mL via ORAL
  Filled 2015-10-25: qty 15

## 2015-10-25 MED ORDER — OXYTOCIN 40 UNITS IN LACTATED RINGERS INFUSION - SIMPLE MED
2.5000 [IU]/h | INTRAVENOUS | Status: DC
  Filled 2015-10-25: qty 1000

## 2015-10-25 MED ORDER — FENTANYL 2.5 MCG/ML BUPIVACAINE 1/10 % EPIDURAL INFUSION (WH - ANES): 14.0000 mL/h | INTRAMUSCULAR | Status: DC | PRN

## 2015-10-25 MED ORDER — PHENYLEPHRINE 40 MCG/ML (10ML) SYRINGE FOR IV PUSH (FOR BLOOD PRESSURE SUPPORT)
80.0000 ug | PREFILLED_SYRINGE | INTRAVENOUS | Status: DC | PRN
  Filled 2015-10-25: qty 5

## 2015-10-25 MED ORDER — OXYCODONE-ACETAMINOPHEN 5-325 MG PO TABS: 1.0000 | ORAL_TABLET | ORAL | Status: DC | PRN

## 2015-10-25 MED ORDER — OXYTOCIN 40 UNITS IN LACTATED RINGERS INFUSION - SIMPLE MED
1.0000 m[IU]/min | INTRAVENOUS | Status: DC
  Administered 2015-10-25: 2 m[IU]/min via INTRAVENOUS

## 2015-10-25 MED ORDER — EPHEDRINE 5 MG/ML INJ
10.0000 mg | INTRAVENOUS | Status: DC | PRN
  Filled 2015-10-25: qty 2

## 2015-10-25 MED ORDER — EPHEDRINE 5 MG/ML INJ
10.0000 mg | INTRAVENOUS | Status: DC | PRN
Start: 2015-10-25 — End: 2015-10-26
  Filled 2015-10-25: qty 2

## 2015-10-25 MED ORDER — LACTATED RINGERS IV SOLN
INTRAVENOUS | Status: DC
  Administered 2015-10-25: 125 mL/h via INTRAVENOUS
  Administered 2015-10-25: 21:00:00 via INTRAVENOUS
  Administered 2015-10-25 (×2): 125 mL/h via INTRAVENOUS

## 2015-10-25 MED ORDER — LIDOCAINE-EPINEPHRINE (PF) 2 %-1:200000 IJ SOLN
INTRAMUSCULAR | Status: DC | PRN
  Administered 2015-10-25 (×2): 4 mL via EPIDURAL

## 2015-10-25 MED ORDER — TERBUTALINE SULFATE 1 MG/ML IJ SOLN
0.2500 mg | Freq: Once | INTRAMUSCULAR | Status: DC | PRN
  Filled 2015-10-25: qty 1

## 2015-10-25 MED ORDER — LACTATED RINGERS IV SOLN
500.0000 mL | Freq: Once | INTRAVENOUS | Status: AC
  Administered 2015-10-25: 500 mL via INTRAVENOUS

## 2015-10-25 MED ORDER — LACTATED RINGERS IV SOLN
500.0000 mL | INTRAVENOUS | Status: DC | PRN
  Administered 2015-10-25: 500 mL via INTRAVENOUS

## 2015-10-25 MED ORDER — OXYCODONE-ACETAMINOPHEN 5-325 MG PO TABS: 2.0000 | ORAL_TABLET | ORAL | Status: DC | PRN

## 2015-10-25 MED ORDER — PHENYLEPHRINE 40 MCG/ML (10ML) SYRINGE FOR IV PUSH (FOR BLOOD PRESSURE SUPPORT)
80.0000 ug | PREFILLED_SYRINGE | INTRAVENOUS | Status: DC | PRN
  Filled 2015-10-25: qty 5
  Filled 2015-10-25 (×2): qty 10

## 2015-10-25 MED ORDER — OXYTOCIN BOLUS FROM INFUSION
500.0000 mL | INTRAVENOUS | Status: DC
  Administered 2015-10-25: 500 mL via INTRAVENOUS

## 2015-10-25 NOTE — Anesthesia Preprocedure Evaluation (Signed)
Anesthesia Evaluation  Patient identified by MRN, date of birth, ID band Patient awake    Airway Mallampati: II  TM Distance: >3 FB Neck ROM: Full    Dental no notable dental hx. 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 managed negative neurological ROS  negative psych ROS   GI/Hepatic Neg liver ROS, GERD  ,  Endo/Other  negative endocrine ROS  Renal/GU Hx/o renal calculi  negative genitourinary   Musculoskeletal negative musculoskeletal ROS (+)   Abdominal   Peds  Hematology   Anesthesia Other Findings   Reproductive/Obstetrics (+) Pregnancy                             Anesthesia Physical Anesthesia Plan  ASA: II  Anesthesia Plan: Epidural   Post-op Pain Management:    Induction:   Airway Management Planned: Natural Airway  Additional Equipment:   Intra-op Plan:   Post-operative Plan:   Informed Consent: I have reviewed the patients History and Physical, chart, labs and discussed the procedure including the risks, benefits and alternatives for the proposed anesthesia with the patient or authorized representative who has indicated his/her understanding and acceptance.     Plan Discussed with: Anesthesiologist  Anesthesia Plan Comments:         Anesthesia Quick Evaluation

## 2015-10-25 NOTE — Anesthesia Pain Management Evaluation Note (Signed)
  CRNA Pain Management Visit Note  Patient: Rebecca Crosby, 25 y.o., female  "Hello I am a member of the anesthesia team at [facility]. We have an anesthesia team available at all times to provide care throughout the hospital, including epidural management and anesthesia for C-section. I don't know your plan for the delivery whether it a natural birth, water birth, IV sedation, nitrous supplementation, doula or epidural, but we want to meet your pain goals."   1.Was your pain managed to your expectations on prior hospitalizations?   No prior hospitalizations  2.What is your expectation for pain management during this hospitalization?     Epidural  3.How can we help you reach that goal? Epidural.  MDA called for epidural; RN in room.  Record the patient's initial score and the patient's pain goal.   Pain: 6  Pain Goal: 4 [facility] wants you to be able to say your pain was always managed very well.  Skylynn Burkley L 10/25/2015

## 2015-10-25 NOTE — H&P (Signed)
25 y.o. [id number]  G1P0 comes in c/o labor.  Otherwise has good fetal movement and no bleeding.  Past Medical History  Diagnosis Date  . Metacarpal bone fracture 12/03/2013    right ring  . History of anemia 2008  . History of kidney stones     Past Surgical History  Procedure Laterality Date  . No past surgeries    . Open reduction internal fixation (orif) metacarpal Right 12/12/2013    Procedure: OPEN REDUCTION INTERNAL FIXATION (ORIF) RIGHT RING FINGER METACARPAL;  Surgeon: Tami RibasKevin R Kuzma, MD;  Location: [facility];  Service: Orthopedics;  Laterality: Right;    OB History  Gravida Para Term Preterm AB SAB TAB Ectopic Multiple Living  1 0            # Outcome Date GA Lbr Len/2nd Weight Sex Delivery Anes PTL Lv  1 Current               Social History   Social History  . Marital Status: Single    Spouse Name: N/A  . Number of Children: N/A  . Years of Education: N/A   Occupational History  . Not on file.   Social History Main Topics  . Smoking status: Former Smoker -- 0.50 packs/day    Types: Cigarettes    Quit date: 04/27/2015  . Smokeless tobacco: Never Used  . Alcohol Use: No     Comment: occasionally  . Drug Use: No  . Sexual Activity: Yes    Birth Control/ Protection: None   Other Topics Concern  . Not on file   Social History Narrative   Review of patient's allergies indicates no known allergies.    Prenatal Transfer Tool  Maternal Diabetes: No Genetic Screening: Normal Maternal Ultrasounds/Referrals: Normal Fetal Ultrasounds or other Referrals:  None Maternal Substance Abuse:  No Significant Maternal Medications:  None Significant Maternal Lab Results: None  Other PNC: uncomplicated.    Filed Vitals:   10/25/15 1444 10/25/15 1449  BP:    Pulse: 58 61  Temp:    Resp:       Lungs/Cor:  NAD Abdomen:  soft, gravid Ex:  no cords, erythema SVE:  4-5/100/-2, intact FHTs:  120s, good STV, NST R Toco:  q4-5   A/P   Term  labor.  GBS neg.  Porshea Janowski A

## 2015-10-25 NOTE — Anesthesia Procedure Notes (Signed)
Epidural Patient location during procedure: OB Start time: 10/25/2015 2:06 PM  Staffing Anesthesiologist: Mal AmabileFOSTER, Nyelli Samara  Preanesthetic Checklist Completed: patient identified, site marked, surgical consent, pre-op evaluation, timeout performed, IV checked, risks and benefits discussed and monitors and equipment checked  Epidural Patient position: sitting Prep: site prepped and draped and DuraPrep Patient monitoring: continuous pulse ox and blood pressure Approach: midline Location: L3-L4 Injection technique: LOR air  Needle:  Needle type: Tuohy  Needle gauge: 17 G Needle length: 9 cm and 9 Needle insertion depth: 4 cm Catheter type: closed end flexible Catheter size: 19 Gauge Catheter at skin depth: 9 cm Test dose: negative and Other  Assessment Events: blood not aspirated, injection not painful, no injection resistance, negative IV test and no paresthesia  Additional Notes Patient identified. Risks and benefits discussed including failed block, incomplete  Pain control, post dural puncture headache, nerve damage, paralysis, blood pressure Changes, nausea, vomiting, reactions to medications-both toxic and allergic and post Partum back pain. All questions were answered. Patient expressed understanding and wished to proceed. Sterile technique was used throughout procedure. Epidural site was Dressed with sterile barrier dressing. No paresthesias, signs of intravascular injection Or signs of intrathecal spread were encountered.  Patient was more comfortable after the epidural was dosed. Please see RN's note for documentation of vital signs and FHR which are stable.

## 2015-10-25 NOTE — MAU Note (Signed)
Patient contracting every 15 minutes.  Was seen in office today and SVE was 4cm.  Was 2cm for last 3 weeks.  Denies LOF or vaginal bleeding.  Reports +fetal movement.

## 2015-10-26 ENCOUNTER — Encounter (HOSPITAL_COMMUNITY): Payer: Self-pay

## 2015-10-26 LAB — CBC
HEMATOCRIT: 30.4 % — AB (ref 36.0–46.0)
HEMOGLOBIN: 10.4 g/dL — AB (ref 12.0–15.0)
MCH: 28.7 pg (ref 26.0–34.0)
MCHC: 34.2 g/dL (ref 30.0–36.0)
MCV: 83.7 fL (ref 78.0–100.0)
Platelets: 166 10*3/uL (ref 150–400)
RBC: 3.63 MIL/uL — ABNORMAL LOW (ref 3.87–5.11)
RDW: 13.7 % (ref 11.5–15.5)
WBC: 14 10*3/uL — AB (ref 4.0–10.5)

## 2015-10-26 LAB — RPR: RPR: NONREACTIVE

## 2015-10-26 MED ORDER — IBUPROFEN 800 MG PO TABS
800.0000 mg | ORAL_TABLET | Freq: Three times a day (TID) | ORAL | Status: DC
  Administered 2015-10-26 (×4): 800 mg via ORAL
  Filled 2015-10-26 (×4): qty 1

## 2015-10-26 MED ORDER — TETANUS-DIPHTH-ACELL PERTUSSIS 5-2.5-18.5 LF-MCG/0.5 IM SUSP: 0.5000 mL | Freq: Once | INTRAMUSCULAR | Status: DC

## 2015-10-26 MED ORDER — ACETAMINOPHEN 325 MG PO TABS: 650.0000 mg | ORAL_TABLET | ORAL | Status: DC | PRN

## 2015-10-26 MED ORDER — METHYLERGONOVINE MALEATE 0.2 MG PO TABS: 0.2000 mg | ORAL_TABLET | ORAL | Status: DC | PRN

## 2015-10-26 MED ORDER — METHYLERGONOVINE MALEATE 0.2 MG/ML IJ SOLN: 0.2000 mg | INTRAMUSCULAR | Status: DC | PRN

## 2015-10-26 MED ORDER — PRENATAL MULTIVITAMIN CH
1.0000 | ORAL_TABLET | Freq: Every day | ORAL | Status: DC
  Administered 2015-10-26: 1 via ORAL
  Filled 2015-10-26: qty 1

## 2015-10-26 MED ORDER — SODIUM CHLORIDE 0.9% FLUSH: 3.0000 mL | Freq: Two times a day (BID) | INTRAVENOUS | Status: DC

## 2015-10-26 MED ORDER — ONDANSETRON HCL 4 MG/2ML IJ SOLN: 4.0000 mg | INTRAMUSCULAR | Status: DC | PRN

## 2015-10-26 MED ORDER — ZOLPIDEM TARTRATE 5 MG PO TABS: 5.0000 mg | ORAL_TABLET | Freq: Every evening | ORAL | Status: DC | PRN

## 2015-10-26 MED ORDER — SENNOSIDES-DOCUSATE SODIUM 8.6-50 MG PO TABS
2.0000 | ORAL_TABLET | ORAL | Status: DC
  Administered 2015-10-26: 2 via ORAL
  Filled 2015-10-26: qty 2

## 2015-10-26 MED ORDER — OXYCODONE-ACETAMINOPHEN 5-325 MG PO TABS: 1.0000 | ORAL_TABLET | ORAL | Status: DC | PRN

## 2015-10-26 MED ORDER — SODIUM CHLORIDE 0.9% FLUSH: 3.0000 mL | INTRAVENOUS | Status: DC | PRN

## 2015-10-26 MED ORDER — DIBUCAINE 1 % RE OINT: 1.0000 "application " | TOPICAL_OINTMENT | RECTAL | Status: DC | PRN

## 2015-10-26 MED ORDER — SIMETHICONE 80 MG PO CHEW
80.0000 mg | CHEWABLE_TABLET | ORAL | Status: DC | PRN
  Filled 2015-10-26: qty 1

## 2015-10-26 MED ORDER — WITCH HAZEL-GLYCERIN EX PADS: 1.0000 "application " | MEDICATED_PAD | CUTANEOUS | Status: DC | PRN

## 2015-10-26 MED ORDER — BENZOCAINE-MENTHOL 20-0.5 % EX AERO
1.0000 "application " | INHALATION_SPRAY | CUTANEOUS | Status: DC | PRN
  Administered 2015-10-26: 1 via TOPICAL
  Filled 2015-10-26: qty 56

## 2015-10-26 MED ORDER — MEASLES, MUMPS & RUBELLA VAC ~~LOC~~ INJ
0.5000 mL | INJECTION | Freq: Once | SUBCUTANEOUS | Status: DC
  Filled 2015-10-26: qty 0.5

## 2015-10-26 MED ORDER — COCONUT OIL OIL: 1.0000 "application " | TOPICAL_OIL | Status: DC | PRN

## 2015-10-26 MED ORDER — ONDANSETRON HCL 4 MG PO TABS: 4.0000 mg | ORAL_TABLET | ORAL | Status: DC | PRN

## 2015-10-26 MED ORDER — FERROUS SULFATE 325 (65 FE) MG PO TABS
325.0000 mg | ORAL_TABLET | Freq: Two times a day (BID) | ORAL | Status: DC
  Administered 2015-10-26 (×2): 325 mg via ORAL
  Filled 2015-10-26 (×2): qty 1

## 2015-10-26 MED ORDER — MAGNESIUM HYDROXIDE 400 MG/5ML PO SUSP: 30.0000 mL | ORAL | Status: DC | PRN

## 2015-10-26 MED ORDER — OXYCODONE-ACETAMINOPHEN 5-325 MG PO TABS: 2.0000 | ORAL_TABLET | ORAL | Status: DC | PRN

## 2015-10-26 MED ORDER — SODIUM CHLORIDE 0.9 % IV SOLN: 250.0000 mL | INTRAVENOUS | Status: DC | PRN

## 2015-10-26 MED ORDER — DIPHENHYDRAMINE HCL 25 MG PO CAPS
25.0000 mg | ORAL_CAPSULE | Freq: Four times a day (QID) | ORAL | Status: DC | PRN
Start: 2015-10-26 — End: 2015-10-27

## 2015-10-26 NOTE — Lactation Note (Signed)
This note was copied from a baby's chart. Lactation Consultation Note  Patient Name: Rebecca Crosby WUJWJ'XToday's Date: 10/26/2015 Reason for consult: Initial assessment  Mom assisted w/latching infant to the breast. Infant latched w/ease. Mom shown specifics of an asymmetric latch (via [employer] website animation) so she could replicate it. Parents taught signs/sound of swallowing. Swallows verified by cervical auscultation.   Mom made aware of O/P services, breastfeeding support groups, community resources, and our phone # for post-discharge questions.   [name], [name] 10/26/2015, 1:46 PM

## 2015-10-26 NOTE — Progress Notes (Signed)
Patient is eating, ambulating, voiding.  Pain control is good.  Appropriate lochia, no complaints.  Filed Vitals:   10/26/15 0100 10/26/15 0130 10/26/15 0230 10/26/15 0630  BP: 103/65 108/59 104/57 111/64  Pulse: 70 61 73 64  Temp:  98 F (36.7 C) 98.6 F (37 C) 98.3 F (36.8 C)  TempSrc:  Oral Oral Oral  Resp: 17 18 18 18   Height:      Weight:      SpO2:        Fundus firm Perineum without swelling. No CT  Lab Results  Component Value Date   WBC 14.0* 10/26/2015   HGB 10.4* 10/26/2015   HCT 30.4* 10/26/2015   MCV 83.7 10/26/2015   PLT 166 10/26/2015    --/--/B POS, B POS (05/25 1125)  A/P Post partum day 1. Doing well  Routine care.  Expect d/c 5/27.    Philip AspenALLAHAN, Beryle Zeitz

## 2015-10-26 NOTE — Anesthesia Postprocedure Evaluation (Signed)
Anesthesia Post Note  Patient: Rebecca Crosby  Procedure(s) Performed: * No procedures listed *  Patient location during evaluation: Mother Baby Anesthesia Type: Epidural Level of consciousness: awake and alert Pain management: pain level controlled Vital Signs Assessment: post-procedure vital signs reviewed and stable Respiratory status: spontaneous breathing, nonlabored ventilation and respiratory function stable Cardiovascular status: stable Postop Assessment: no headache, no backache and epidural receding Anesthetic complications: no     Last Vitals:  Filed Vitals:   10/26/15 0230 10/26/15 0630  BP: 104/57 111/64  Pulse: 73 64  Temp: 37 C 36.8 C  Resp: 18 18    Last Pain:  Filed Vitals:   10/26/15 0733  PainSc: 3    Pain Goal: Patients Stated Pain Goal: 4 (10/25/15 1923)               [facility] Center[facility]SHALL,Antonique Langford

## 2015-10-27 NOTE — Discharge Summary (Signed)
Obstetric Discharge Summary Reason for Admission: onset of labor Prenatal Procedures: ultrasound Intrapartum Procedures: spontaneous vaginal delivery Postpartum Procedures: none Complications-Operative and Postpartum: none HEMOGLOBIN  Date Value Ref Range Status  10/26/2015 10.4* 12.0 - 15.0 g/dL Final   HCT  Date Value Ref Range Status  10/26/2015 30.4* 36.0 - 46.0 % Final    Physical Exam:  General: alert and cooperative Lochia: appropriate Uterine Fundus: firm DVT Evaluation: No evidence of DVT seen on physical exam.  Discharge Diagnoses: Term Pregnancy-delivered  Discharge Information: Date: 10/27/2015 Activity: pelvic rest Diet: routine Medications: PNV and Ibuprofen Condition: stable Instructions: refer to practice specific booklet Discharge to: home Follow-up Information    Follow up with HORVATH,MICHELLE A, MD In 4 weeks.   Specialty:  Obstetrics and Gynecology   Contact information:   [address] [place] [address] [phone]       Newborn Data: Live born female  Birth Weight: 6 lb 2.1 oz (2781 g) APGAR: 9, 9  Home with mother.  Rebecca Crosby, Rebecca Crosby 10/27/2015, 10:04 AM

## 2015-10-27 NOTE — Lactation Note (Addendum)
This note was copied from a baby's chart. Lactation Consultation Note  Patient Name: Rebecca Crosby ZOXWR'UToday's Date: 10/27/2015 Reason for consult: Follow-up assessment;Infant < 6lbs 5% weight loss,  Baby is 35 hours and for D/C today . Per parents F/U appt with Pedis  Will be on Tuesday.  According to the doc flow sheets 0 baby is breast feeding well, LS 's 8-9's.  Voids and stools QS for age. @ 23 hours Bili 4.4 .  Sore nipple and engorgement prevention and tx reviewed - referring to the baby and me booklets Pages 24 -25. Per mom has a DEBP at home,  [facility] stressed the importance when baby is feeding baby needs to be nutritive and if she is non - nutritive to  Stimulate her and if she doesn't respond release suction, burp and remind baby it is feeding time , if she  Is  Not showing feeding cues she probably is done.  Per mom breast are fuller and warmer today and larger. LC reassured mom that is a good sign .  Due to the baby being less than 6 pounds , not to go over 3 hours without waking the baby up.  Mother informed of post-discharge support and given phone number to the lactation department, including services for phone call assistance; out-patient appointments; and breastfeeding support group. List of other breastfeeding resources in the community given in the handout. Encouraged mother to call for problems or concerns related to breastfeeding.    Maternal Data    Feeding - [facility]  Feeding Type: Breast Fed Length of feed: 20 min  LATCH Score/Interventions                Intervention(s): Breastfeeding basics reviewed     Lactation Tools Discussed/Used     Consult Status Consult Status: Complete Date: 10/27/15    Kathrin Greathouseorio, Rebecca Crosby 10/27/2015, 10:54 AM

## 2015-10-27 NOTE — Progress Notes (Signed)
Patient stated at 1030 that she didn't want to wait for the Social work after an hourish.  MD has cleared mother and Pediatrician has cleared infant to go home. No concerns with the parents they have been wonderful and very appropriate in their care with the infant.

## 2015-11-30 ENCOUNTER — Emergency Department (HOSPITAL_COMMUNITY)
Admission: EM | Admit: 2015-11-30 | Discharge: 2015-11-30 | Disposition: A | Discharge status: Home / Self Care | Payer: 59 | Attending: Emergency Medicine | Admitting: Emergency Medicine

## 2015-11-30 ENCOUNTER — Encounter (HOSPITAL_COMMUNITY): Payer: Self-pay

## 2015-11-30 ENCOUNTER — Emergency Department (HOSPITAL_COMMUNITY): Payer: 59

## 2015-11-30 DIAGNOSIS — R1084 Generalized abdominal pain: Secondary | ICD-10-CM | POA: Diagnosis present

## 2015-11-30 DIAGNOSIS — R112 Nausea with vomiting, unspecified: Secondary | ICD-10-CM | POA: Diagnosis not present

## 2015-11-30 DIAGNOSIS — Z79899 Other long term (current) drug therapy: Secondary | ICD-10-CM | POA: Insufficient documentation

## 2015-11-30 DIAGNOSIS — R300 Dysuria: Secondary | ICD-10-CM | POA: Insufficient documentation

## 2015-11-30 DIAGNOSIS — R109 Unspecified abdominal pain: Secondary | ICD-10-CM

## 2015-11-30 DIAGNOSIS — Z87891 Personal history of nicotine dependence: Secondary | ICD-10-CM | POA: Insufficient documentation

## 2015-11-30 LAB — URINALYSIS, ROUTINE W REFLEX MICROSCOPIC
BILIRUBIN URINE: NEGATIVE
Glucose, UA: NEGATIVE mg/dL
Ketones, ur: 40 mg/dL — AB
Nitrite: NEGATIVE
PH: 7 (ref 5.0–8.0)
Protein, ur: 30 mg/dL — AB
SPECIFIC GRAVITY, URINE: 1.025 (ref 1.005–1.030)

## 2015-11-30 LAB — CBC WITH DIFFERENTIAL/PLATELET
BASOS PCT: 0 %
Basophils Absolute: 0 10*3/uL (ref 0.0–0.1)
EOS PCT: 0 %
Eosinophils Absolute: 0 10*3/uL (ref 0.0–0.7)
HEMATOCRIT: 37.1 % (ref 36.0–46.0)
Hemoglobin: 12.5 g/dL (ref 12.0–15.0)
LYMPHS ABS: 1 10*3/uL (ref 0.7–4.0)
Lymphocytes Relative: 8 %
MCH: 28.3 pg (ref 26.0–34.0)
MCHC: 33.7 g/dL (ref 30.0–36.0)
MCV: 84.1 fL (ref 78.0–100.0)
MONO ABS: 0.4 10*3/uL (ref 0.1–1.0)
MONOS PCT: 3 %
NEUTROS PCT: 89 %
Neutro Abs: 10.6 10*3/uL — ABNORMAL HIGH (ref 1.7–7.7)
PLATELETS: 244 10*3/uL (ref 150–400)
RBC: 4.41 MIL/uL (ref 3.87–5.11)
RDW: 12.4 % (ref 11.5–15.5)
WBC: 12 10*3/uL — AB (ref 4.0–10.5)

## 2015-11-30 LAB — BASIC METABOLIC PANEL
ANION GAP: 8 (ref 5–15)
BUN: 28 mg/dL — AB (ref 6–20)
CO2: 24 mmol/L (ref 22–32)
Calcium: 9.4 mg/dL (ref 8.9–10.3)
Chloride: 108 mmol/L (ref 101–111)
Creatinine, Ser: 0.92 mg/dL (ref 0.44–1.00)
GFR calc Af Amer: >60 mL/min (ref >60)
GLUCOSE: 112 mg/dL — AB (ref 65–99)
POTASSIUM: 4.3 mmol/L (ref 3.5–5.1)
Sodium: 140 mmol/L (ref 135–145)

## 2015-11-30 LAB — URINE MICROSCOPIC-ADD ON

## 2015-11-30 LAB — I-STAT BETA HCG BLOOD, ED (MC, WL, AP ONLY): I-stat hCG, quantitative: <5 m[IU]/mL (ref <5)

## 2015-11-30 MED ORDER — OXYCODONE-ACETAMINOPHEN 5-325 MG PO TABS
1.0000 | ORAL_TABLET | ORAL | Status: DC | PRN
  Administered 2015-11-30: 1 via ORAL
  Filled 2015-11-30: qty 1

## 2015-11-30 MED ORDER — CEPHALEXIN 500 MG PO CAPS: 500.0000 mg | ORAL_CAPSULE | Freq: Two times a day (BID) | ORAL | Status: DC

## 2015-11-30 MED ORDER — ONDANSETRON HCL 4 MG/2ML IJ SOLN
4.0000 mg | Freq: Once | INTRAMUSCULAR | Status: AC
  Administered 2015-11-30: 4 mg via INTRAVENOUS
  Filled 2015-11-30: qty 2

## 2015-11-30 MED ORDER — KETOROLAC TROMETHAMINE 30 MG/ML IJ SOLN
30.0000 mg | Freq: Once | INTRAMUSCULAR | Status: AC
  Administered 2015-11-30: 30 mg via INTRAVENOUS
  Filled 2015-11-30: qty 1

## 2015-11-30 MED ORDER — ONDANSETRON 4 MG PO TBDP
4.0000 mg | ORAL_TABLET | Freq: Once | ORAL | Status: AC | PRN
  Administered 2015-11-30: 4 mg via ORAL
  Filled 2015-11-30: qty 1

## 2015-11-30 NOTE — Discharge Instructions (Signed)
Take the prescribed medication as directed.  This is safe in pregnancy.   Since you did receive some pain medications here in the emergency department, recommend to pump and dump 3 prior to resuming breast-feeding. Follow-up with urology if any persistent symptoms. Return to the ED for new or worsening symptoms.

## 2015-11-30 NOTE — ED Notes (Signed)
Pt vomited immediately after medication was given.

## 2015-11-30 NOTE — Progress Notes (Signed)
Patient listed as having [employer] insurance without a pcp.  EDCm spoke to patient at bedside.  [facility] with [facility] of [facility] who [facility] within a 20 mile radius of patient's zip code [phone].  Patient thankful for resources.  No further EDCM needs at this time.

## 2015-11-30 NOTE — ED Provider Notes (Signed)
CSN: [id number]     Arrival date & time 11/30/15  1418 History   First MD Initiated Contact with Patient 11/30/15 1626     Chief Complaint  Patient presents with  . Flank Pain  . Emesis     (Consider location/radiation/quality/duration/timing/severity/associated sxs/prior Treatment) Patient is a 25 y.o. female presenting with flank pain and vomiting. The history is provided by the patient and medical records.  Flank Pain Associated symptoms include nausea and vomiting.  Emesis   25 year old female with history of anemia and kidney stones, presenting to the ED for left flank pain. Patient states she was feeling well earlier today but around lunchtime when she was putting her daughter to sleep she developed some aching, left-sided flank pain. She states he was able to eat a small amount of having worsening pain shortly after. She states pain transition to a sharp, stabbing pain with radiation to her left lower abdomen and left groin. She does report some dysuria and dark colored urine today. Denies any fever or chills.  States she has had some nausea and vomiting as well. She has not had anything to eat or drink since lunchtime. Patient is currently breast-feeding. Does report history of kidney stones, last episode was in 2009. Patient was given Percocet and Zofran in triage which has helped her symptoms.  Past Medical History  Diagnosis Date  . Metacarpal bone fracture 12/03/2013    right ring  . History of anemia 2008  . History of kidney stones    Past Surgical History  Procedure Laterality Date  . No past surgeries    . Open reduction internal fixation (orif) metacarpal Right 12/12/2013    Procedure: OPEN REDUCTION INTERNAL FIXATION (ORIF) RIGHT RING FINGER METACARPAL;  Surgeon: Tami RibasKevin R Kuzma, MD;  Location: [facility];  Service: Orthopedics;  Laterality: Right;   History reviewed. No pertinent family history. Social History  Substance Use Topics  . Smoking status:  Former Smoker -- 0.50 packs/day    Types: Cigarettes    Quit date: 04/27/2015  . Smokeless tobacco: Never Used  . Alcohol Use: Yes     Comment: occasionally   OB History    Gravida Para Term Preterm AB TAB SAB Ectopic Multiple Living   1 1 1       0 1     Review of Systems  Gastrointestinal: Positive for nausea and vomiting.  Genitourinary: Positive for dysuria and flank pain.  All other systems reviewed and are negative.     Allergies  Review of patient's allergies indicates no known allergies.  Home Medications   Prior to Admission medications   Medication Sig Start Date End Date Taking? Authorizing Provider  Prenatal Vit-Fe Fumarate-FA (PRENATAL PO) Take 1 tablet by mouth daily.   Yes Historical Provider, MD   BP 128/111 mmHg  Pulse 80  Temp(Src) 98.1 F (36.7 C) (Oral)  Resp 20  Ht 5\' 2"  (1.575 m)  Wt 49.896 kg  BMI 20.11 kg/m2  SpO2 100%  Breastfeeding? Yes   Physical Exam  Constitutional: She is oriented to person, place, and time. She appears well-developed and well-nourished.  HENT:  Head: Normocephalic and atraumatic.  Mouth/Throat: Oropharynx is clear and moist.  Eyes: Conjunctivae and EOM are normal. Pupils are equal, round, and reactive to light.  Neck: Normal range of motion.  Cardiovascular: Normal rate, regular rhythm and normal heart sounds.   Pulmonary/Chest: Effort normal and breath sounds normal.  Abdominal: Soft. Bowel sounds are normal. There is no tenderness.  There is CVA tenderness (mild, left). There is no rigidity and no guarding.  Musculoskeletal: Normal range of motion.  Neurological: She is alert and oriented to person, place, and time.  Skin: Skin is warm and dry.  Psychiatric: She has a normal mood and affect.  Nursing note and vitals reviewed.   ED Course  Procedures (including critical care time) Labs Review Labs Reviewed  URINALYSIS, ROUTINE W REFLEX MICROSCOPIC (NOT AT [facility]) - Abnormal; Notable for the following:     APPearance CLOUDY (*)    Hgb urine dipstick MODERATE (*)    Ketones, ur 40 (*)    Protein, ur 30 (*)    Leukocytes, UA SMALL (*)    All other components within normal limits  CBC WITH DIFFERENTIAL/PLATELET - Abnormal; Notable for the following:    WBC 12.0 (*)    Neutro Abs 10.6 (*)    All other components within normal limits  BASIC METABOLIC PANEL - Abnormal; Notable for the following:    Glucose, Bld 112 (*)    BUN 28 (*)    All other components within normal limits  URINE MICROSCOPIC-ADD ON - Abnormal; Notable for the following:    Squamous Epithelial / LPF 6-30 (*)    Bacteria, UA FEW (*)    All other components within normal limits  URINE CULTURE  I-STAT BETA HCG BLOOD, ED ([facility], [facility], [facility] ONLY)    Imaging Review Ct Renal Stone Study  11/30/2015  CLINICAL DATA:  25 year old female with left flank pain. EXAM: CT ABDOMEN AND PELVIS WITHOUT CONTRAST TECHNIQUE: Multidetector CT imaging of the abdomen and pelvis was performed following the standard protocol without IV contrast. COMPARISON:  CT dated 07/07/2010 FINDINGS: Evaluation of this exam is limited in the absence of intravenous contrast. The visualized lung bases are clear. No intra-abdominal free air. Small free fluid within pelvis. The liver, gallbladder, pancreas, spleen, and the adrenal glands appear unremarkable. There multiple nonobstructing bilateral renal calculi measuring up to 7 mm in the upper pole of the left kidney. No hydronephrosis. Ill-defined left renal inferior pole subcentimeter hypodense focus is not characterized but may represent a cyst. The visualized ureters and urinary bladder appear unremarkable. The uterus is anteverted and grossly unremarkable with the visualized ovaries appear unremarkable as well. Evaluation of the bowel is limited in the absence of oral contrast. There is no evidence of bowel obstruction or active inflammation. Normal appendix. The abdominal aorta and IVC appear grossly unremarkable on this  noncontrast study. No portal venous gas identified. There is no adenopathy. There is diastases of anterior abdominal wall musculature in the midline with a small fat containing umbilical hernia. The abdominal wall soft tissues are otherwise unremarkable. The osseous structures are intact. Stop IMPRESSION: Nonobstructing bilateral renal calculi.  No hydronephrosis. Electronically Signed   By: Elgie Collard M.D.   On: 11/30/2015 19:16   I have personally reviewed and evaluated these images and lab results as part of my medical decision-making.   EKG Interpretation None      MDM   Final diagnoses:  Flank pain  Dysuria   25 y.o. F here with left flank pain and dysuria.  Patient afebrile, non-toxic.  Mild left CVA tenderness on exam.  Labs without acute findings.  U/a with moderate blood and leuks noted.  Culture pending.  CT renal study with bilateral nephrolithiasis-- no ureteral stones.  Pain improved here after percocet and Toradol.  No further emesis, tolerating PO well.  Will d/c home with keflex pending urine  culture.  Advised to pump-dump x3 at minimum given the medications she received here.  Follow-up with urology if any issues occur.  Discussed plan with patient, he/she acknowledged understanding and agreed with plan of care.  Return precautions given for new or worsening symptoms.  Garlon HatchetLisa M Justiss Gerbino, PA-C 11/30/15 2053  Lavera Guiseana Duo Liu, MD 12/01/15 1224

## 2015-11-30 NOTE — ED Notes (Addendum)
Pain medication given in Triage. Patient advised about side effects of medications and  to avoid driving for a minimum of 4 hours.  Since Pt is breastfeeding, medications approved by Verdie MosherLiu MD.  Pt educated to "pump and dump" x 24 hours.

## 2015-11-30 NOTE — ED Notes (Signed)
Pt c/o L flank pain radiating into L abdomen and emesis starting this morning.  Pain score 9/10.  Pt reports taking Tylenol w/o relief.  Hx of kidney stones.

## 2015-12-02 LAB — URINE CULTURE: Culture: <10000 — AB

## 2016-01-23 ENCOUNTER — Emergency Department (HOSPITAL_COMMUNITY): Payer: 59

## 2016-01-23 ENCOUNTER — Encounter (HOSPITAL_COMMUNITY): Payer: Self-pay | Admitting: Neurology

## 2016-01-23 ENCOUNTER — Emergency Department (HOSPITAL_COMMUNITY)
Admission: EM | Admit: 2016-01-23 | Discharge: 2016-01-23 | Disposition: A | Discharge status: Home / Self Care | Payer: 59 | Attending: Emergency Medicine | Admitting: Emergency Medicine

## 2016-01-23 DIAGNOSIS — Z87891 Personal history of nicotine dependence: Secondary | ICD-10-CM | POA: Insufficient documentation

## 2016-01-23 DIAGNOSIS — N202 Calculus of kidney with calculus of ureter: Secondary | ICD-10-CM | POA: Insufficient documentation

## 2016-01-23 DIAGNOSIS — R109 Unspecified abdominal pain: Secondary | ICD-10-CM | POA: Diagnosis present

## 2016-01-23 DIAGNOSIS — N201 Calculus of ureter: Secondary | ICD-10-CM

## 2016-01-23 LAB — URINALYSIS, ROUTINE W REFLEX MICROSCOPIC
Bilirubin Urine: NEGATIVE
GLUCOSE, UA: NEGATIVE mg/dL
Ketones, ur: 15 mg/dL — AB
LEUKOCYTES UA: NEGATIVE
Nitrite: NEGATIVE
PROTEIN: NEGATIVE mg/dL
SPECIFIC GRAVITY, URINE: 1.019 (ref 1.005–1.030)
pH: 6 (ref 5.0–8.0)

## 2016-01-23 LAB — CBC
HEMATOCRIT: 41.3 % (ref 36.0–46.0)
HEMOGLOBIN: 13.8 g/dL (ref 12.0–15.0)
MCH: 28.8 pg (ref 26.0–34.0)
MCHC: 33.4 g/dL (ref 30.0–36.0)
MCV: 86 fL (ref 78.0–100.0)
Platelets: 226 10*3/uL (ref 150–400)
RBC: 4.8 MIL/uL (ref 3.87–5.11)
RDW: 12.5 % (ref 11.5–15.5)
WBC: 8.1 10*3/uL (ref 4.0–10.5)

## 2016-01-23 LAB — BASIC METABOLIC PANEL
ANION GAP: 10 (ref 5–15)
BUN: 14 mg/dL (ref 6–20)
CHLORIDE: 106 mmol/L (ref 101–111)
CO2: 24 mmol/L (ref 22–32)
Calcium: 10 mg/dL (ref 8.9–10.3)
Creatinine, Ser: 1.15 mg/dL — ABNORMAL HIGH (ref 0.44–1.00)
GFR calc non Af Amer: >60 mL/min (ref >60)
GLUCOSE: 113 mg/dL — AB (ref 65–99)
Potassium: 3.7 mmol/L (ref 3.5–5.1)
Sodium: 140 mmol/L (ref 135–145)

## 2016-01-23 LAB — I-STAT BETA HCG BLOOD, ED (MC, WL, AP ONLY): I-stat hCG, quantitative: <5 m[IU]/mL (ref <5)

## 2016-01-23 LAB — URINE MICROSCOPIC-ADD ON

## 2016-01-23 MED ORDER — KETOROLAC TROMETHAMINE 60 MG/2ML IM SOLN
60.0000 mg | Freq: Once | INTRAMUSCULAR | Status: DC
  Filled 2016-01-23: qty 2

## 2016-01-23 MED ORDER — OXYCODONE-ACETAMINOPHEN 5-325 MG PO TABS
1.0000 | ORAL_TABLET | Freq: Four times a day (QID) | ORAL | 0 refills | Status: DC | PRN
Start: 2016-01-23 — End: 2016-02-07

## 2016-01-23 MED ORDER — SODIUM CHLORIDE 0.9 % IV SOLN
INTRAVENOUS | Status: DC
  Administered 2016-01-23: 20:00:00 via INTRAVENOUS

## 2016-01-23 MED ORDER — KETOROLAC TROMETHAMINE 30 MG/ML IJ SOLN
30.0000 mg | Freq: Once | INTRAMUSCULAR | Status: AC
  Administered 2016-01-23: 30 mg via INTRAVENOUS

## 2016-01-23 MED ORDER — ONDANSETRON 4 MG PO TBDP
4.0000 mg | ORAL_TABLET | Freq: Once | ORAL | Status: AC | PRN
  Administered 2016-01-23: 4 mg via ORAL

## 2016-01-23 MED ORDER — OXYCODONE-ACETAMINOPHEN 5-325 MG PO TABS
1.0000 | ORAL_TABLET | ORAL | Status: AC | PRN
  Administered 2016-01-23 (×2): 1 via ORAL
  Filled 2016-01-23: qty 1

## 2016-01-23 MED ORDER — ONDANSETRON 4 MG PO TBDP
ORAL_TABLET | ORAL | Status: AC
  Filled 2016-01-23: qty 1

## 2016-01-23 MED ORDER — OXYCODONE-ACETAMINOPHEN 5-325 MG PO TABS
ORAL_TABLET | ORAL | Status: AC
  Administered 2016-01-23: 1 via ORAL
  Filled 2016-01-23: qty 1

## 2016-01-23 NOTE — ED Provider Notes (Signed)
[facility] DEPT Provider Note   CSN: [id number] Arrival date & time: 01/23/16  1643     History   Chief Complaint Chief Complaint  Patient presents with  . Flank Pain    HPI Rebecca Crosby is a 25 y.o. female.  HPI   The patient presents today with complaints of left flank pain. Her symptoms started this morning. She has a history of kidney stones and reports that this feels the same. She recently had a baby 3 months ago but denies having any vaginal bleeding or discharge. She has had some mild dysuria. She has not had any diarrhea. She has experienced some nausea and vomiting. She says the pain started a higher originally and now she feels like it worked its way down.  Past Medical History:  Diagnosis Date  . History of anemia 2008  . History of kidney stones   . Metacarpal bone fracture 12/03/2013   right ring    Patient Active Problem List   Diagnosis Date Noted  . Postpartum state 10/26/2015  . Pregnancy 10/25/2015  . Closed fracture of facial bones (HCC) 06/27/2014  . Facial laceration 06/27/2014  . Fall 06/27/2014  . TBI (traumatic brain injury) (HCC) 06/26/2014  . Alcohol intoxication (HCC) 06/25/2014    Past Surgical History:  Procedure Laterality Date  . NO PAST SURGERIES    . OPEN REDUCTION INTERNAL FIXATION (ORIF) METACARPAL Right 12/12/2013   Procedure: OPEN REDUCTION INTERNAL FIXATION (ORIF) RIGHT RING FINGER METACARPAL;  Surgeon: Tami Ribas, MD;  Location: [facility];  Service: Orthopedics;  Laterality: Right;    OB History    Gravida Para Term Preterm AB Living   1 1 1     1    SAB TAB Ectopic Multiple Live Births         0 1       Home Medications    Prior to Admission medications   Medication Sig Start Date End Date Taking? Authorizing Provider  ondansetron (ZOFRAN) 4 MG tablet Take 4 mg by mouth every 8 (eight) hours as needed for nausea or vomiting.   Yes Historical Provider, MD  tamsulosin (FLOMAX) 0.4 MG CAPS capsule  Take 0.4 mg by mouth.   Yes Historical Provider, MD  cephALEXin (KEFLEX) 500 MG capsule Take 1 capsule (500 mg total) by mouth 2 (two) times daily. Patient not taking: Reported on 01/23/2016 11/30/15   Garlon Hatchet, PA-C  oxyCODONE-acetaminophen (PERCOCET/ROXICET) 5-325 MG tablet Take 1 tablet by mouth every 6 (six) hours as needed. 01/23/16   Marlon Pel, PA-C    Family History No family history on file.  Social History Social History  Substance Use Topics  . Smoking status: Former Smoker    Packs/day: 0.50    Types: Cigarettes    Quit date: 04/27/2015  . Smokeless tobacco: Never Used  . Alcohol use Yes     Comment: occasionally     Allergies   Review of patient's allergies indicates no known allergies.   Review of Systems Review of Systems  Review of Systems All other systems negative except as documented in the HPI. All pertinent positives and negatives as reviewed in the HPI.   Physical Exam Updated Vital Signs BP 116/81   Pulse (!) 55   Temp 98.4 F (36.9 C) (Oral)   Resp 14   Wt 47.6 kg   LMP 01/09/2016   SpO2 100%   BMI 19.20 kg/m   Physical Exam  Constitutional: She appears well-developed and well-nourished.  HENT:  Head: Normocephalic and atraumatic.  Eyes: Conjunctivae are normal. Pupils are equal, round, and reactive to light.  Neck: Trachea normal, normal range of motion and full passive range of motion without pain. Neck supple.  Cardiovascular: Normal rate, regular rhythm and normal pulses.   Pulmonary/Chest: Effort normal and breath sounds normal. Chest wall is not dull to percussion. She exhibits no tenderness, no crepitus, no edema, no deformity and no retraction.  Abdominal: Soft. Normal appearance and bowel sounds are normal. There is tenderness (mild). There is CVA tenderness (left side). There is no rigidity, no rebound, no guarding, no tenderness at McBurney's point and negative Murphy's sign.  Musculoskeletal: Normal range of motion.    Lymphadenopathy:       Head (right side): No submental, no submandibular, no tonsillar, no preauricular, no posterior auricular and no occipital adenopathy present.       Head (left side): No submental, no submandibular, no tonsillar, no preauricular, no posterior auricular and no occipital adenopathy present.    She has no cervical adenopathy.    She has no axillary adenopathy.  Neurological: She is alert. She has normal strength.  Skin: Skin is warm, dry and intact.  Psychiatric: She has a normal mood and affect. Her speech is normal and behavior is normal. Judgment and thought content normal. Cognition and memory are normal.  Nursing note and vitals reviewed.    ED Treatments / Results  Labs (all labs ordered are listed, but only abnormal results are displayed) Labs Reviewed  URINALYSIS, ROUTINE W REFLEX MICROSCOPIC (NOT AT [facility]) - Abnormal; Notable for the following:       Result Value   APPearance CLOUDY (*)    Hgb urine dipstick LARGE (*)    Ketones, ur 15 (*)    All other components within normal limits  BASIC METABOLIC PANEL - Abnormal; Notable for the following:    Glucose, Bld 113 (*)    Creatinine, Ser 1.15 (*)    All other components within normal limits  URINE MICROSCOPIC-ADD ON - Abnormal; Notable for the following:    Squamous Epithelial / LPF 0-5 (*)    Bacteria, UA RARE (*)    All other components within normal limits  CBC  I-STAT BETA HCG BLOOD, ED ([facility], [facility], [facility] ONLY)    EKG  EKG Interpretation None       Radiology Ct Renal Stone Study  Result Date: 01/23/2016 CLINICAL DATA:  Left flank pain beginning this morning. Recent IUD placement. EXAM: CT ABDOMEN AND PELVIS WITHOUT CONTRAST TECHNIQUE: Multidetector CT imaging of the abdomen and pelvis was performed following the standard protocol without IV contrast. COMPARISON:  11/30/2015 and 07/07/2010 FINDINGS: Lower chest:  Lung bases are unremarkable. Hepatobiliary: No mass visualized on this un-enhanced  exam. Pancreas: No mass or inflammatory process identified on this un-enhanced exam. Spleen: Within normal limits in size. Adrenals/Urinary Tract: Adrenal glands are within normal. Kidneys are normal in size with multiple bilateral renal stones. Small cyst over the mid to lower pole left kidney unchanged. Minimal prominence of the left intrarenal collecting system and ureter. There is a 6 mm calcification projected in in the region of the distal left ureter as the ureter is difficult to follow through this region. This calcification likely is a distal left ureteral stone. Right ureter is within normal. Stomach/Bowel: Stomach is within normal. Small bowel and appendix are within normal. Colon is unremarkable. Vascular/Lymphatic: No pathologically enlarged lymph nodes. No evidence of abdominal aortic aneurysm. Reproductive: No mass or  other significant abnormality. IUD appears in adequate position. Tampon present over the vagina. Other: Small amount of free pelvic fluid. Musculoskeletal:  No suspicious bone lesions identified. IMPRESSION: Moderate bilateral nephrolithiasis. 6 mm stone over the distal left ureter causing low-grade obstruction. Small left renal cyst unchanged. Small amount of free pelvic fluid likely physiologic. Electronically Signed   By: Elberta Fortisaniel  Boyle M.D.   On: 01/23/2016 21:01    Procedures Procedures (including critical care time)  Medications Ordered in ED Medications  0.9 %  sodium chloride infusion ( Intravenous Stopped 01/23/16 2212)  oxyCODONE-acetaminophen (PERCOCET/ROXICET) 5-325 MG per tablet 1 tablet (1 tablet Oral Given 01/23/16 2023)  ondansetron (ZOFRAN-ODT) disintegrating tablet 4 mg (4 mg Oral Given 01/23/16 1712)  ketorolac (TORADOL) 30 MG/ML injection 30 mg (30 mg Intravenous Given 01/23/16 2023)     Initial Impression / Assessment and Plan / ED Course  I have reviewed the triage vital signs and the nursing notes.  Pertinent labs & imaging results that were available  during my care of the patient were reviewed by me and considered in my medical decision making (see chart for details).  Clinical Course   8:41 pm The patient's lab work revealed hematuria.otherwise neg pregnancy and normal CBC and BMP. Renal stone CT study ordered.  Patient has a 6mm stone at the distall ureter with low-grade obstruction. She is no longer having pain, is urinating normal, no infection in urine and normal kidney function. She feels like she is close to passing the stone. She has zofran and flomax at home. Will rx pain medication and refer to [facility].  Medications  0.9 %  sodium chloride infusion ( Intravenous Stopped 01/23/16 2212)  oxyCODONE-acetaminophen (PERCOCET/ROXICET) 5-325 MG per tablet 1 tablet (1 tablet Oral Given 01/23/16 2023)  ondansetron (ZOFRAN-ODT) disintegrating tablet 4 mg (4 mg Oral Given 01/23/16 1712)  ketorolac (TORADOL) 30 MG/ML injection 30 mg (30 mg Intravenous Given 01/23/16 2023)    I discussed results, diagnoses and plan with Edwena BlowMollie Kukla. They voice there understanding and questions were answered. We discussed follow-up recommendations and return precautions.   Final Clinical Impressions(s) / ED Diagnoses   Final diagnoses:  Left ureteral stone    New Prescriptions Discharge Medication List as of 01/23/2016 10:00 PM    START taking these medications   Details  oxyCODONE-acetaminophen (PERCOCET/ROXICET) 5-325 MG tablet Take 1 tablet by mouth every 6 (six) hours as needed., Starting Wed 01/23/2016, Print         Marlon Peliffany Dajha Urquilla, PA-C 01/23/16 2330    Charlynne Panderavid Hsienta Yao, MD 01/24/16 1520

## 2016-01-23 NOTE — ED Notes (Signed)
EDP at bedside  

## 2016-01-23 NOTE — ED Notes (Signed)
Offered pt intranasal fentanyl but she did not want it.

## 2016-01-23 NOTE — ED Triage Notes (Signed)
Pt here with left flank pain since this morning. Has hx of kidney stones, and feels same. Reports n/v due to pain. Is urinating to baseline. Is alert, appears uncomfortable.

## 2016-02-07 ENCOUNTER — Encounter (HOSPITAL_COMMUNITY): Payer: Self-pay

## 2016-02-07 ENCOUNTER — Emergency Department (HOSPITAL_COMMUNITY): Payer: 59

## 2016-02-07 ENCOUNTER — Emergency Department (HOSPITAL_COMMUNITY)
Admission: EM | Admit: 2016-02-07 | Discharge: 2016-02-07 | Disposition: A | Discharge status: Home / Self Care | Payer: 59 | Attending: Emergency Medicine | Admitting: Emergency Medicine

## 2016-02-07 DIAGNOSIS — N1339 Other hydronephrosis: Secondary | ICD-10-CM | POA: Diagnosis not present

## 2016-02-07 DIAGNOSIS — N23 Unspecified renal colic: Secondary | ICD-10-CM | POA: Diagnosis not present

## 2016-02-07 DIAGNOSIS — Z87891 Personal history of nicotine dependence: Secondary | ICD-10-CM | POA: Insufficient documentation

## 2016-02-07 DIAGNOSIS — R1030 Lower abdominal pain, unspecified: Secondary | ICD-10-CM | POA: Diagnosis present

## 2016-02-07 LAB — URINALYSIS, ROUTINE W REFLEX MICROSCOPIC
Bilirubin Urine: NEGATIVE
GLUCOSE, UA: NEGATIVE mg/dL
Ketones, ur: NEGATIVE mg/dL
LEUKOCYTES UA: NEGATIVE
Nitrite: NEGATIVE
PH: 5.5 (ref 5.0–8.0)
Protein, ur: NEGATIVE mg/dL
Specific Gravity, Urine: 1.018 (ref 1.005–1.030)

## 2016-02-07 LAB — BASIC METABOLIC PANEL
Anion gap: 9 (ref 5–15)
BUN: 20 mg/dL (ref 6–20)
CHLORIDE: 107 mmol/L (ref 101–111)
CO2: 23 mmol/L (ref 22–32)
CREATININE: 1.57 mg/dL — AB (ref 0.44–1.00)
Calcium: 9.5 mg/dL (ref 8.9–10.3)
GFR calc Af Amer: 52 mL/min — ABNORMAL LOW (ref >60)
GFR calc non Af Amer: 45 mL/min — ABNORMAL LOW (ref >60)
GLUCOSE: 102 mg/dL — AB (ref 65–99)
Potassium: 3.8 mmol/L (ref 3.5–5.1)
Sodium: 139 mmol/L (ref 135–145)

## 2016-02-07 LAB — CBC
HEMATOCRIT: 40.7 % (ref 36.0–46.0)
HEMOGLOBIN: 13.4 g/dL (ref 12.0–15.0)
MCH: 28.6 pg (ref 26.0–34.0)
MCHC: 32.9 g/dL (ref 30.0–36.0)
MCV: 87 fL (ref 78.0–100.0)
Platelets: 164 10*3/uL (ref 150–400)
RBC: 4.68 MIL/uL (ref 3.87–5.11)
RDW: 12.7 % (ref 11.5–15.5)
WBC: 7.6 10*3/uL (ref 4.0–10.5)

## 2016-02-07 LAB — URINE MICROSCOPIC-ADD ON

## 2016-02-07 LAB — POC URINE PREG, ED: Preg Test, Ur: NEGATIVE

## 2016-02-07 MED ORDER — OXYCODONE-ACETAMINOPHEN 5-325 MG PO TABS: 1.0000 | ORAL_TABLET | Freq: Four times a day (QID) | ORAL | 0 refills | Status: AC | PRN

## 2016-02-07 MED ORDER — SODIUM CHLORIDE 0.9 % IV BOLUS (SEPSIS)
1000.0000 mL | Freq: Once | INTRAVENOUS | Status: AC
  Administered 2016-02-07: 1000 mL via INTRAVENOUS

## 2016-02-07 MED ORDER — TAMSULOSIN HCL 0.4 MG PO CAPS: 0.4000 mg | ORAL_CAPSULE | Freq: Every day | ORAL | 0 refills | Status: AC

## 2016-02-07 MED ORDER — KETOROLAC TROMETHAMINE 30 MG/ML IJ SOLN
30.0000 mg | Freq: Once | INTRAMUSCULAR | Status: AC
Start: 2016-02-07 — End: 2016-02-07
  Administered 2016-02-07: 30 mg via INTRAVENOUS
  Filled 2016-02-07: qty 1

## 2016-02-07 MED ORDER — ONDANSETRON 4 MG PO TBDP: 4.0000 mg | ORAL_TABLET | Freq: Three times a day (TID) | ORAL | 0 refills | Status: AC | PRN

## 2016-02-07 NOTE — ED Notes (Addendum)
Patient came in with c/o abdominal pain. She was diagnosed with kidney stones last month, but her pain has worsened in her abdomen, left side flank, and with voiding. She states she has been nauseous and then vomited x1 today. She has been taking percocet and zofran, and the percocet was not helping like it usually does. Patient has been straining her urine but reports she has not seen any stones pass. Denies fevers.

## 2016-02-07 NOTE — ED Triage Notes (Signed)
Pt. Was diagnosed with a kidney stone on 8/23 and she is having increased pain and also increased pain when voiding.  She is also having nausea and vomiting and she reports that the Percocet usually helps and it is not.  She voided this am and pt; is very  Uncomfortable.  Pt. ;s skin is pale, warm and dry. No distress noted.

## 2016-02-07 NOTE — ED Provider Notes (Signed)
[facility] Provider Note   CSN: [id number] Arrival date & time: 02/07/16  [id number]     History   Chief Complaint Chief Complaint  Patient presents with  . Abdominal Pain    HPI Rebecca Crosby is a 25 y.o. female.  Patient with kidney stone history presents with continued left flank and groin pain. Patient was seen in emergency department on 01/23/16 and diagnosed with a 6 mm ureteral stone with mild hydronephrosis. Patient was treated and she has felt better until yesterday at 3 PM when pain returned. Pain has been persistent since then. She has had nausea but no vomiting. No fever, chest pain, shortness of breath. No diarrhea or vaginal complaints. Patient has had dysuria over the past several days. She has been straining her urine but has not noted any debris. Patient took a Percocet without relief. She states she was going to call [facility] today for an appointment however the pain was too much to wait. Onset of symptoms acute. Course is constant. Nothing makes symptoms better or worse.      Past Medical History:  Diagnosis Date  . History of anemia 2008  . History of kidney stones   . Metacarpal bone fracture 12/03/2013   right ring    Patient Active Problem List   Diagnosis Date Noted  . Postpartum state 10/26/2015  . Pregnancy 10/25/2015  . Closed fracture of facial bones (HCC) 06/27/2014  . Facial laceration 06/27/2014  . Fall 06/27/2014  . TBI (traumatic brain injury) (HCC) 06/26/2014  . Alcohol intoxication (HCC) 06/25/2014    Past Surgical History:  Procedure Laterality Date  . NO PAST SURGERIES    . OPEN REDUCTION INTERNAL FIXATION (ORIF) METACARPAL Right 12/12/2013   Procedure: OPEN REDUCTION INTERNAL FIXATION (ORIF) RIGHT RING FINGER METACARPAL;  Surgeon: Tami RibasKevin R Kuzma, MD;  Location: [facility];  Service: Orthopedics;  Laterality: Right;    OB History    Gravida Para Term Preterm AB Living   1 1 1     1    SAB TAB Ectopic Multiple  Live Births         0 1       Home Medications    Prior to Admission medications   Medication Sig Start Date End Date Taking? Authorizing Provider  cephALEXin (KEFLEX) 500 MG capsule Take 1 capsule (500 mg total) by mouth 2 (two) times daily. Patient not taking: Reported on 01/23/2016 11/30/15   Garlon HatchetLisa M Sanders, PA-C  ondansetron (ZOFRAN) 4 MG tablet Take 4 mg by mouth every 8 (eight) hours as needed for nausea or vomiting.    Historical Provider, MD  oxyCODONE-acetaminophen (PERCOCET/ROXICET) 5-325 MG tablet Take 1 tablet by mouth every 6 (six) hours as needed. 01/23/16   Marlon Peliffany Greene, PA-C  tamsulosin (FLOMAX) 0.4 MG CAPS capsule Take 0.4 mg by mouth.    Historical Provider, MD    Family History No family history on file.  Social History Social History  Substance Use Topics  . Smoking status: Former Smoker    Packs/day: 0.50    Types: Cigarettes    Quit date: 04/27/2015  . Smokeless tobacco: Never Used  . Alcohol use Yes     Comment: occasionally     Allergies   Review of patient's allergies indicates no known allergies.   Review of Systems Review of Systems  Constitutional: Negative for fever.  HENT: Negative for rhinorrhea and sore throat.   Eyes: Negative for redness.  Respiratory: Negative for cough.   Cardiovascular:  Negative for chest pain.  Gastrointestinal: Positive for abdominal pain and nausea. Negative for diarrhea and vomiting.  Genitourinary: Positive for dysuria and flank pain. Negative for hematuria.  Musculoskeletal: Negative for myalgias.  Skin: Negative for rash.  Neurological: Negative for headaches.     Physical Exam Updated Vital Signs BP 104/77   Pulse (!) 53   Temp 98.2 F (36.8 C) (Oral)   Resp 18   Ht 5\' 3"  (1.6 m)   Wt 45.8 kg   LMP 01/09/2016   SpO2 97%   BMI 17.89 kg/m   Physical Exam  Constitutional: She appears well-developed and well-nourished.  HENT:  Head: Normocephalic and atraumatic.  Eyes: Conjunctivae are  normal. Right eye exhibits no discharge. Left eye exhibits no discharge.  Neck: Normal range of motion. Neck supple.  Cardiovascular: Normal rate, regular rhythm and normal heart sounds.  Exam reveals no gallop and no friction rub.   No murmur heard. Pulmonary/Chest: Effort normal and breath sounds normal.  Abdominal: Soft. She exhibits no mass. There is tenderness (Mild left lower quadrant). There is no guarding.  Neurological: She is alert.  Skin: Skin is warm and dry.  Psychiatric: She has a normal mood and affect.  Nursing note and vitals reviewed.    ED Treatments / Results  Labs (all labs ordered are listed, but only abnormal results are displayed) Labs Reviewed  URINALYSIS, ROUTINE W REFLEX MICROSCOPIC (NOT AT [facility]) - Abnormal; Notable for the following:       Result Value   Hgb urine dipstick LARGE (*)    All other components within normal limits  BASIC METABOLIC PANEL - Abnormal; Notable for the following:    Glucose, Bld 102 (*)    Creatinine, Ser 1.57 (*)    GFR calc non Af Amer 45 (*)    GFR calc Af Amer 52 (*)    All other components within normal limits  URINE MICROSCOPIC-ADD ON - Abnormal; Notable for the following:    Squamous Epithelial / LPF 0-5 (*)    Bacteria, UA FEW (*)    All other components within normal limits  CBC  POC URINE PREG, ED    Radiology US Renal  Result Date: 02/07/2016 CLINICAL DATA:  Recurring pain since 3 p.m. yesterday, prior LEFT great toe calculus EXAM: RENAL / URINARY TRACT ULTRASOUND COMPLETE COMPARISON:  CT abdomen and pelvis 01/23/2016 FINDINGS: Right Kidney: Length: 10.4 cm. Normal cortical thickness and echogenicity. No gross hydronephrosis or renal mass. No shadowing calcifications. Left Kidney: Length: 10.6 cm. Normal cortical thickness and echogenicity. Persistent mild-to-moderate collecting system dilatation. No mass or shadowing calcification. Bladder: Contains minimal urine, patient voided prior to exam, inadequately assessed.  IMPRESSION: Persistent LEFT hydronephrosis. Kidneys otherwise sonographically unremarkable. Electronically Signed   By: Ulyses Southward M.D.   On: 02/07/2016 12:43    Procedures Procedures (including critical care time)  Medications Ordered in ED Medications  ketorolac (TORADOL) 30 MG/ML injection 30 mg (30 mg Intravenous Given 02/07/16 1124)  sodium chloride 0.9 % bolus 1,000 mL (0 mLs Intravenous Stopped 02/07/16 1448)     Initial Impression / Assessment and Plan / ED Course  I have reviewed the triage vital signs and the nursing notes.  Pertinent labs & imaging results that were available during my care of the patient were reviewed by me and considered in my medical decision making (see chart for details).  Clinical Course   Patient seen and examined. Work-up initiated. Medications ordered.   Vital signs reviewed and are as  follows: BP 99/64   Pulse (!) 55   Temp 98.2 F (36.8 C) (Oral)   Resp 16   Ht 5\' 3"  (1.6 m)   Wt 45.8 kg   LMP 01/09/2016   SpO2 97%   BMI 17.89 kg/m   Patient informed of results. Treated with fluids. Her pain is currently well controlled. Will discharge to home with prescription for Percocet, Flomax, Zofran. She will call [facility] to schedule an appointment.   Patient counseled on kidney stone treatment. Urged patient to strain urine and save any stones. Urged urology follow-up and return to [facility] with any complications. Counseled patient to maintain good fluid intake.   Counseled patient on use of Flomax.   Patient counseled on use of narcotic pain medications. Counseled not to combine these medications with others containing tylenol. Urged not to drink alcohol, drive, or perform any other activities that requires focus while taking these medications. The patient verbalizes understanding and agrees with the plan.   Final Clinical Impressions(s) / ED Diagnoses   Final diagnoses:  Ureteral colic   Patient with continued ureteral colic, left-sided  flank pain, hematuria noted on UA. No infection. Normal white count. Patient has mildly elevated creatinine from her baseline. This was treated with IV fluids. She will need urology follow-up to ensure that the stone is passing. No indication for emergent urological consultation at this time.    New Prescriptions New Prescriptions   ONDANSETRON (ZOFRAN ODT) 4 MG DISINTEGRATING TABLET    Take 1 tablet (4 mg total) by mouth every 8 (eight) hours as needed for nausea or vomiting.   OXYCODONE-ACETAMINOPHEN (PERCOCET/ROXICET) 5-325 MG TABLET    Take 1-2 tablets by mouth every 6 (six) hours as needed for severe pain.   TAMSULOSIN (FLOMAX) 0.4 MG CAPS CAPSULE    Take 1 capsule (0.4 mg total) by mouth daily.     Renne Crigler, PA-C 02/07/16 1456    Vanetta Mulders, MD 02/13/16 1544

## 2016-02-07 NOTE — Discharge Instructions (Signed)
Please read and follow all provided instructions.  Your diagnoses today include:  1. Ureteral colic     Tests performed today include:  Urine test that showed blood in your urine and no infection  Blood test that showed slightly low kidney function compared to your baseline  Vital signs. See below for your results today.   Medications prescribed:   Percocet (oxycodone/acetaminophen) - narcotic pain medication  DO NOT drive or perform any activities that require you to be awake and alert because this medicine can make you drowsy. BE VERY CAREFUL not to take multiple medicines containing Tylenol (also called acetaminophen). Doing so can lead to an overdose which can damage your liver and cause liver failure and possibly death.   Zofran (ondansetron) - for nausea and vomiting   Flomax (tamsulosin) - relaxes smooth muscle to help kidney stones pass  Take any prescribed medications only as directed.  Home care instructions:  Follow any educational materials contained in this packet.  Please double your fluid intake for the next several days. Strain your urine and save any stones that may pass.   Follow-up instructions: Please follow-up with your urologist or the urologist referral (provided on front page) in the next 1 week for further evaluation of your symptoms.  If you need to return to the Emergency Department, go to [facility] and not [facility]. The urologists are located at [facility] and can better care for you at this location.  Return instructions:  If you need to return to the Emergency Department, go to [facility] and not [facility]. The [facility]rologists are [facility] at [facility] and can better care for you at this location.   Please return to the Emergency Department if you experience worsening symptoms.  Please return if you develop fever or uncontrolled pain or vomiting.  Please return if you have any other emergent  concerns.  Additional Information:  Your vital signs today were: BP 99/64    Pulse (!) 55    Temp 98.2 F (36.8 C) (Oral)    Resp 16    Ht 5\' 3"  (1.6 m)    Wt 45.8 kg    LMP 01/09/2016    SpO2 97%    BMI 17.89 kg/m  If your blood pressure (BP) was elevated above 135/85 this visit, please have this repeated by your doctor within one month. --------------

## 2018-04-15 ENCOUNTER — Other Ambulatory Visit: Payer: Self-pay

## 2018-04-15 ENCOUNTER — Emergency Department
Admission: EM | Admit: 2018-04-15 | Discharge: 2018-04-15 | Disposition: A | Discharge status: Home / Self Care | Payer: Self-pay | Source: Home / Self Care | Attending: Family Medicine | Admitting: Family Medicine

## 2018-04-15 DIAGNOSIS — J0100 Acute maxillary sinusitis, unspecified: Secondary | ICD-10-CM

## 2018-04-15 MED ORDER — AMOXICILLIN 875 MG PO TABS: 875.0000 mg | ORAL_TABLET | Freq: Two times a day (BID) | ORAL | 0 refills | Status: AC

## 2018-04-15 MED ORDER — PREDNISONE 20 MG PO TABS: ORAL_TABLET | ORAL | 0 refills | Status: AC

## 2018-04-15 NOTE — ED Triage Notes (Signed)
Pt stated sx started as cold sx on November 3.  Sore throat, nose congestion, took mucinex and felt better late last week.  Saturday felt worse than before, and felt feverish for about 3 days.  The last 3 days, head pressure, ear pressure, and runny nose.

## 2018-04-15 NOTE — Discharge Instructions (Addendum)
Continue plain guaifenesin (1200mg extended release tabs such as Mucinex) twice daily, with plenty of water, for cough and congestion.  May add Pseudoephedrine (30mg, one or two every 4 to 6 hours) for sinus congestion.  Get adequate rest.   May use Afrin nasal spray (or generic oxymetazoline) each morning for about 5 days and then discontinue.  Also recommend using saline nasal spray several times daily and saline nasal irrigation (AYR is a common brand).  Use Flonase nasal spray each morning after using Afrin nasal spray and saline nasal irrigation. Stop all antihistamines for now, and other non-prescription cough/cold preparations.   

## 2018-04-15 NOTE — ED Provider Notes (Signed)
[facility]    CSN: [id number] Arrival date & time: 04/15/18  1320     History   Chief Complaint Chief Complaint  Patient presents with  . Facial Pain  . Nasal Congestion    HPI Rebecca Crosby is a 27 y.o. female.   About 11 days ago patient developed mild cold-like illness with sore throat and nasal congestion, but no cough. She thought she was improving while treating her symptoms with Mucinex.  However, about 5 days ago she began to feel fatigued with myalgias, chills, and increased sinus congestion.  She has developed facial pain, and upper teeth are uncomfortable.  She had chills last night.  The history is provided by the patient.    Past Medical History:  Diagnosis Date  . History of anemia 2008  . History of kidney stones   . Metacarpal bone fracture 12/03/2013   right ring    Patient Active Problem List   Diagnosis Date Noted  . Postpartum state 10/26/2015  . Pregnancy 10/25/2015  . Closed fracture of facial bones (HCC) 06/27/2014  . Facial laceration 06/27/2014  . Fall 06/27/2014  . TBI (traumatic brain injury) (HCC) 06/26/2014  . Alcohol intoxication (HCC) 06/25/2014    Past Surgical History:  Procedure Laterality Date  . NO PAST SURGERIES    . OPEN REDUCTION INTERNAL FIXATION (ORIF) METACARPAL Right 12/12/2013   Procedure: OPEN REDUCTION INTERNAL FIXATION (ORIF) RIGHT RING FINGER METACARPAL;  Surgeon: Tami Ribas, MD;  Location: [facility];  Service: Orthopedics;  Laterality: Right;    OB History    Gravida  1   Para  1   Term  1   Preterm      AB      Living  1     SAB      TAB      Ectopic      Multiple  0   Live Births  1            Home Medications    Prior to Admission medications   Medication Sig Start Date End Date Taking? Authorizing Provider  amoxicillin (AMOXIL) 875 MG tablet Take 1 tablet (875 mg total) by mouth 2 (two) times daily. 04/15/18   Lattie Haw, MD  Multiple Vitamin  (MULTIVITAMIN WITH MINERALS) TABS tablet Take 1 tablet by mouth daily.    [provider]  ondansetron (ZOFRAN ODT) 4 MG disintegrating tablet Take 1 tablet (4 mg total) by mouth every 8 (eight) hours as needed for nausea or vomiting. 02/07/16   Renne Crigler, PA-C  ondansetron (ZOFRAN) 4 MG tablet Take 4 mg by mouth every 8 (eight) hours as needed for nausea or vomiting.    [provider]  oxyCODONE-acetaminophen (PERCOCET/ROXICET) 5-325 MG tablet Take 1-2 tablets by mouth every 6 (six) hours as needed for severe pain. 02/07/16   Renne Crigler, PA-C  predniSONE (DELTASONE) 20 MG tablet Take one tab by mouth twice daily for 4 days, then one daily for 3 days. Take with food. 04/15/18   Lattie Haw, MD  tamsulosin (FLOMAX) 0.4 MG CAPS capsule Take 1 capsule (0.4 mg total) by mouth daily. 02/07/16   Renne Crigler, PA-C    Family History History reviewed. No pertinent family history.  Social History Social History   Tobacco Use  . Smoking status: Former Smoker    Packs/day: 0.50    Types: Cigarettes    Last attempt to quit: 04/27/2015    Years since quitting:  2.9  . Smokeless tobacco: Never Used  Substance Use Topics  . Alcohol use: Yes    Comment: occasionally  . Drug use: No     Allergies   Patient has no known allergies.   Review of Systems Review of Systems + sore throat No cough No pleuritic pain No wheezing + nasal congestion + post-nasal drainage + sinus pain/pressure No itchy/red eyes ? earache No hemoptysis No SOB ? fever, + chills No nausea No vomiting No abdominal pain No diarrhea No urinary symptoms No skin rash + fatigue + myalgias + headache Used OTC meds without relief   Physical Exam Triage Vital Signs ED Triage Vitals  Enc Vitals Group     BP 04/15/18 1341 120/77     Pulse Rate 04/15/18 1341 63     Resp 04/15/18 1341 20     Temp 04/15/18 1341 97.8 F (36.6 C)     Temp Source 04/15/18 1341 Oral     SpO2 04/15/18 1341  99 %     Weight 04/15/18 1342 104 lb (47.2 kg)     Height 04/15/18 1342 5\' 2"  (1.575 m)     Head Circumference --      Peak Flow --      Pain Score 04/15/18 1341 5     Pain Loc --      Pain Edu? --      Excl. in GC? --    No data found.  Updated Vital Signs BP 120/77 (BP Location: Right Arm)   Pulse 63   Temp 97.8 F (36.6 C) (Oral)   Resp 20   Ht 5\' 2"  (1.575 m)   Wt 47.2 kg   SpO2 99%   BMI 19.02 kg/m   Visual Acuity Right Eye Distance:   Left Eye Distance:   Bilateral Distance:    Right Eye Near:   Left Eye Near:    Bilateral Near:     Physical Exam Nursing notes and Vital Signs reviewed. Appearance:  Patient appears stated age, and in no acute distress Eyes:  Pupils are equal, round, and reactive to light and accomodation.  Extraocular movement is intact.  Conjunctivae are not inflamed  Ears:  Canals normal.  Tympanic membranes normal.  Nose:  Mildly congested turbinates.   Maxillary sinus tenderness is present.  Mouth:  Normal. Pharynx:  Normal Neck:  Supple.  Enlarged posterior/lateral nodes are palpated bilaterally, tender to palpation on the left. Lungs:  Clear to auscultation.  Breath sounds are equal.  Moving air well. Heart:  Regular rate and rhythm without murmurs, rubs, or gallops.  Abdomen:  Extremities:  No edema.  Skin:  No rash present.    UC Treatments / Results  Labs (all labs ordered are listed, but only abnormal results are displayed) Labs Reviewed - No data to display  EKG None  Radiology No results found.  Procedures Procedures (including critical care time)  Medications Ordered in UC Medications - No data to display  Initial Impression / Assessment and Plan / UC Course  I have reviewed the triage vital signs and the nursing notes.  Pertinent labs & imaging results that were available during my care of the patient were reviewed by me and considered in my medical decision making (see chart for details).    Begin amoxicillin,  and prednisone burst/taper. Followup with Family Doctor if not improved in about 10 days.  Final Clinical Impressions(s) / UC Diagnoses   Final diagnoses:  Acute non-recurrent maxillary sinusitis  Discharge Instructions     Continue plain guaifenesin (1200mg  extended release tabs such as Mucinex) twice daily, with plenty of water, for cough and congestion.  May add Pseudoephedrine (30mg , one or two every 4 to 6 hours) for sinus congestion.  Get adequate rest.   May use Afrin nasal spray (or generic oxymetazoline) each morning for about 5 days and then discontinue.  Also recommend using saline nasal spray several times daily and saline nasal irrigation (AYR is a common brand).  Use Flonase nasal spray each morning after using Afrin nasal spray and saline nasal irrigation. Stop all antihistamines for now, and other non-prescription cough/cold preparations.        ED Prescriptions    Medication Sig Dispense Auth. Provider   amoxicillin (AMOXIL) 875 MG tablet Take 1 tablet (875 mg total) by mouth 2 (two) times daily. 20 tablet Lattie Haw, MD   predniSONE (DELTASONE) 20 MG tablet Take one tab by mouth twice daily for 4 days, then one daily for 3 days. Take with food. 11 tablet Lattie Haw, MD         Lattie Haw, MD 04/15/18 [phone]

## 2019-02-24 ENCOUNTER — Other Ambulatory Visit: Payer: Self-pay

## 2019-02-24 DIAGNOSIS — Z20822 Contact with and (suspected) exposure to covid-19: Secondary | ICD-10-CM

## 2019-02-25 LAB — NOVEL CORONAVIRUS, NAA: SARS-CoV-2, NAA: NOT DETECTED

## 2019-02-28 ENCOUNTER — Other Ambulatory Visit: Payer: Self-pay | Admitting: *Deleted

## 2019-02-28 DIAGNOSIS — Z20822 Contact with and (suspected) exposure to covid-19: Secondary | ICD-10-CM

## 2019-03-01 LAB — NOVEL CORONAVIRUS, NAA: SARS-CoV-2, NAA: NOT DETECTED

## 2019-03-07 ENCOUNTER — Other Ambulatory Visit: Payer: Self-pay

## 2019-03-07 DIAGNOSIS — Z20822 Contact with and (suspected) exposure to covid-19: Secondary | ICD-10-CM

## 2019-03-09 LAB — NOVEL CORONAVIRUS, NAA: SARS-CoV-2, NAA: NOT DETECTED

## 2019-08-29 ENCOUNTER — Ambulatory Visit: Payer: Self-pay | Attending: Internal Medicine

## 2019-08-29 DIAGNOSIS — Z23 Encounter for immunization: Secondary | ICD-10-CM

## 2019-08-29 NOTE — Progress Notes (Signed)
   Covid-19 Vaccination Clinic  Name:  Rebecca Crosby    MRN: [id number] DOB: [birth year]  08/29/2019  Rebecca Crosby was observed post Covid-19 immunization for 15 minutes without incident. She was provided with Vaccine Information Sheet and instruction to access the V-Safe system.   Rebecca Crosby was instructed to call 911 with any severe reactions post vaccine: Marland Kitchen Difficulty breathing  . Swelling of face and throat  . A fast heartbeat  . A bad rash all over body  . Dizziness and weakness   Immunizations Administered    Name Date Dose VIS Date Route   Pfizer COVID-19 Vaccine 08/29/2019  3:21 PM 0.3 mL 05/13/2019 Intramuscular   Manufacturer: [employer], [employer]   Lot: [id number]   NDC: [address]-6

## 2019-09-21 ENCOUNTER — Ambulatory Visit: Payer: Self-pay | Attending: Internal Medicine

## 2019-09-21 DIAGNOSIS — Z23 Encounter for immunization: Secondary | ICD-10-CM

## 2019-09-21 NOTE — Progress Notes (Signed)
   Covid-19 Vaccination Clinic  Name:  Rebecca Crosby    MRN: [id number] DOB: 07-26-1990  09/21/2019  [name] was observed post Covid-19 immunization for 15 minutes without incident. She was provided with Vaccine Information Sheet and instruction to access the V-Safe system.   [name] was instructed to call 911 with any severe reactions post vaccine: Marland Kitchen Difficulty breathing  . Swelling of face and throat  . A fast heartbeat  . A bad rash all over body  . Dizziness and weakness   Immunizations Administered    Name Date Dose VIS Date Route   Pfizer COVID-19 Vaccine 09/21/2019  3:34 PM 0.3 mL 07/27/2018 Intramuscular   Manufacturer: [employer], [employer]   Lot: [id number]   NDC: [address]-5

## 2021-07-09 DIAGNOSIS — U071 COVID-19: Secondary | ICD-10-CM | POA: Diagnosis not present

## 2021-07-09 DIAGNOSIS — R519 Headache, unspecified: Secondary | ICD-10-CM | POA: Diagnosis not present

## 2021-07-09 DIAGNOSIS — R52 Pain, unspecified: Secondary | ICD-10-CM | POA: Diagnosis not present

## 2021-08-16 DIAGNOSIS — Z09 Encounter for follow-up examination after completed treatment for conditions other than malignant neoplasm: Secondary | ICD-10-CM | POA: Diagnosis not present

## 2021-08-20 ENCOUNTER — Other Ambulatory Visit: Payer: Self-pay | Admitting: Obstetrics

## 2021-08-20 DIAGNOSIS — N6320 Unspecified lump in the left breast, unspecified quadrant: Secondary | ICD-10-CM

## 2021-09-02 ENCOUNTER — Ambulatory Visit
Admission: RE | Admit: 2021-09-02 | Discharge: 2021-09-02 | Disposition: A | Discharge status: Home / Self Care | Payer: 59 | Source: Ambulatory Visit | Attending: Obstetrics | Admitting: Obstetrics

## 2021-09-02 ENCOUNTER — Other Ambulatory Visit: Payer: Self-pay | Admitting: Obstetrics

## 2021-09-02 DIAGNOSIS — N6320 Unspecified lump in the left breast, unspecified quadrant: Secondary | ICD-10-CM

## 2021-09-02 DIAGNOSIS — R922 Inconclusive mammogram: Secondary | ICD-10-CM | POA: Diagnosis not present

## 2022-03-06 ENCOUNTER — Ambulatory Visit
Admission: RE | Admit: 2022-03-06 | Discharge: 2022-03-06 | Disposition: A | Discharge status: Home / Self Care | Payer: 59 | Source: Ambulatory Visit | Attending: Obstetrics | Admitting: Obstetrics

## 2022-03-06 ENCOUNTER — Other Ambulatory Visit: Payer: Self-pay | Admitting: Obstetrics

## 2022-03-06 DIAGNOSIS — N6320 Unspecified lump in the left breast, unspecified quadrant: Secondary | ICD-10-CM

## 2022-03-06 DIAGNOSIS — N6321 Unspecified lump in the left breast, upper outer quadrant: Secondary | ICD-10-CM | POA: Diagnosis not present

## 2022-03-06 DIAGNOSIS — N6323 Unspecified lump in the left breast, lower outer quadrant: Secondary | ICD-10-CM | POA: Diagnosis not present

## 2022-04-12 DIAGNOSIS — K047 Periapical abscess without sinus: Secondary | ICD-10-CM | POA: Diagnosis not present

## 2022-08-14 DIAGNOSIS — M79642 Pain in left hand: Secondary | ICD-10-CM | POA: Diagnosis not present

## 2022-08-14 DIAGNOSIS — X58XXXA Exposure to other specified factors, initial encounter: Secondary | ICD-10-CM | POA: Diagnosis not present

## 2022-08-14 DIAGNOSIS — S63691A Other sprain of left index finger, initial encounter: Secondary | ICD-10-CM | POA: Diagnosis not present

## 2022-09-05 ENCOUNTER — Other Ambulatory Visit: Payer: 59

## 2022-09-17 ENCOUNTER — Ambulatory Visit
Admission: RE | Admit: 2022-09-17 | Discharge: 2022-09-17 | Disposition: A | Discharge status: Home / Self Care | Payer: 59 | Source: Ambulatory Visit | Attending: Obstetrics | Admitting: Obstetrics

## 2022-09-17 DIAGNOSIS — R922 Inconclusive mammogram: Secondary | ICD-10-CM | POA: Diagnosis not present

## 2022-09-17 DIAGNOSIS — N6321 Unspecified lump in the left breast, upper outer quadrant: Secondary | ICD-10-CM | POA: Diagnosis not present

## 2022-09-17 DIAGNOSIS — N6323 Unspecified lump in the left breast, lower outer quadrant: Secondary | ICD-10-CM | POA: Diagnosis not present

## 2022-09-17 DIAGNOSIS — N6320 Unspecified lump in the left breast, unspecified quadrant: Secondary | ICD-10-CM

## 2022-09-22 DIAGNOSIS — N915 Oligomenorrhea, unspecified: Secondary | ICD-10-CM | POA: Diagnosis not present

## 2022-09-22 DIAGNOSIS — Z01419 Encounter for gynecological examination (general) (routine) without abnormal findings: Secondary | ICD-10-CM | POA: Diagnosis not present

## 2022-09-22 DIAGNOSIS — Z8742 Personal history of other diseases of the female genital tract: Secondary | ICD-10-CM | POA: Diagnosis not present

## 2022-10-15 DIAGNOSIS — N915 Oligomenorrhea, unspecified: Secondary | ICD-10-CM | POA: Diagnosis not present

## 2022-10-15 DIAGNOSIS — E02 Subclinical iodine-deficiency hypothyroidism: Secondary | ICD-10-CM | POA: Diagnosis not present

## 2022-12-16 DIAGNOSIS — E02 Subclinical iodine-deficiency hypothyroidism: Secondary | ICD-10-CM | POA: Diagnosis not present

## 2023-09-14 ENCOUNTER — Other Ambulatory Visit: Payer: Self-pay | Admitting: Obstetrics

## 2023-09-14 DIAGNOSIS — N6320 Unspecified lump in the left breast, unspecified quadrant: Secondary | ICD-10-CM

## 2023-09-30 ENCOUNTER — Ambulatory Visit
Admission: RE | Admit: 2023-09-30 | Discharge: 2023-09-30 | Disposition: A | Discharge status: Home / Self Care | Source: Ambulatory Visit | Attending: Obstetrics | Admitting: Obstetrics

## 2023-09-30 DIAGNOSIS — N6320 Unspecified lump in the left breast, unspecified quadrant: Secondary | ICD-10-CM

## 2023-09-30 DIAGNOSIS — N6325 Unspecified lump in the left breast, overlapping quadrants: Secondary | ICD-10-CM | POA: Diagnosis not present

## 2023-10-24 IMAGING — MG DIGITAL DIAGNOSTIC BILAT W/ TOMO W/ CAD
6 of 10 series · 6 of 30 positions shown · non-contrast
Comparison: None.

CLINICAL DATA: Patient describes a palpable lump within the LEFT
breast at the 3 o'clock axis. This is patient's first mammogram.

EXAM:
DIGITAL DIAGNOSTIC BILATERAL MAMMOGRAM WITH TOMOSYNTHESIS AND CAD;
ULTRASOUND LEFT BREAST LIMITED
TECHNIQUE: Bilateral digital diagnostic mammography and breast tomosynthesis
was performed. The images were evaluated with computer-aided
detection.; Targeted ultrasound examination of the left breast was
performed.

[L MLO synth-2D]
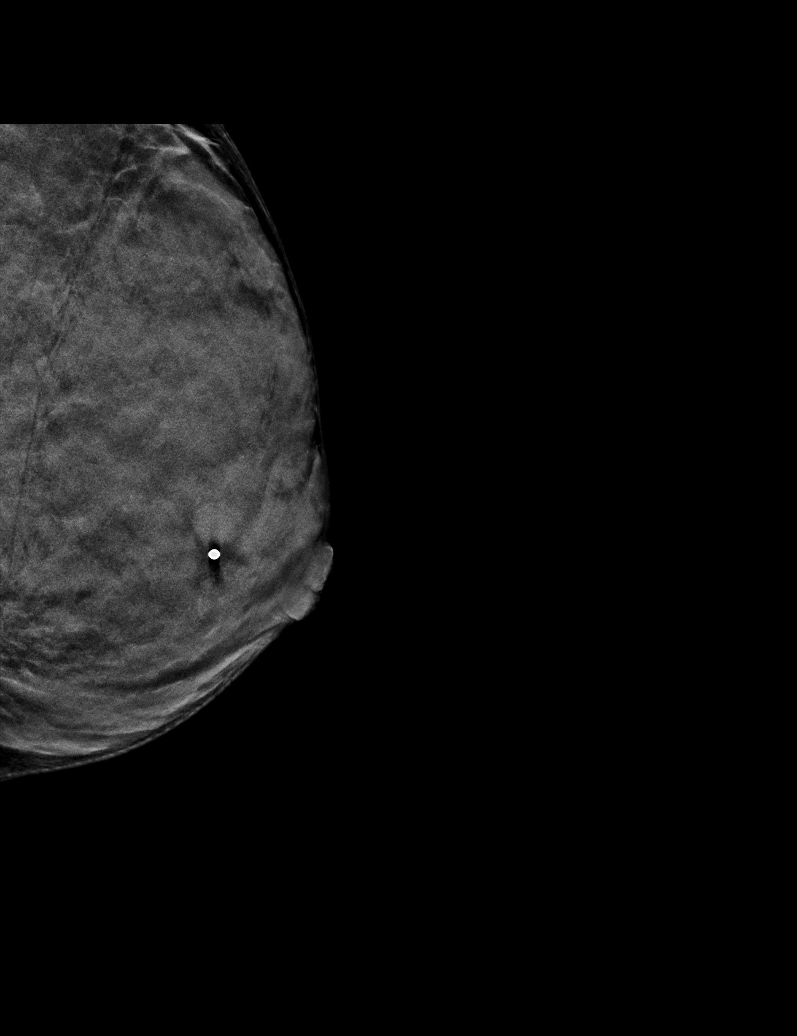

[L CC synth-2D (1 of 2)]
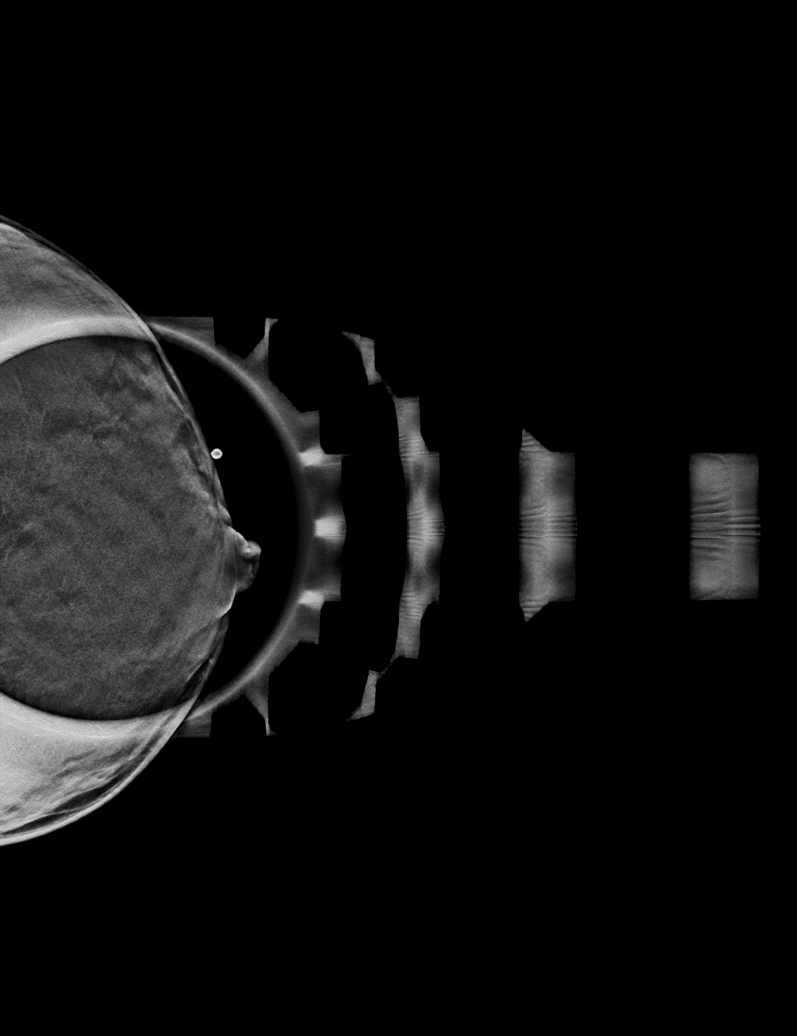

[R MLO synth-2D]
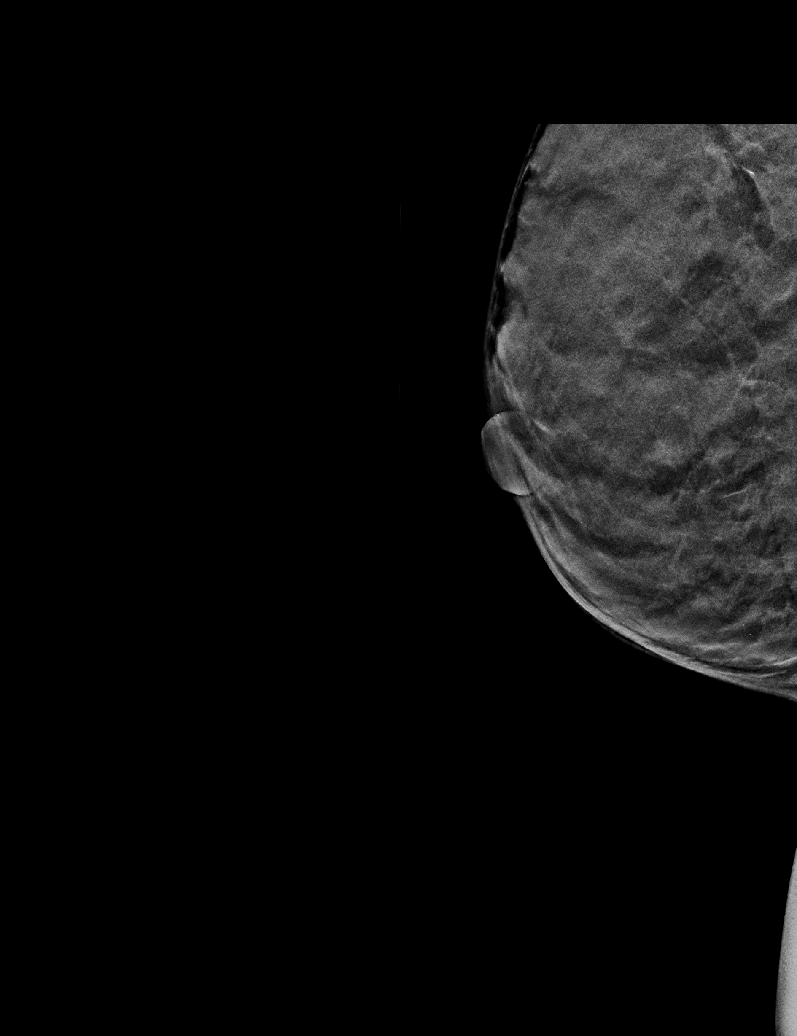

[R CC synth-2D]
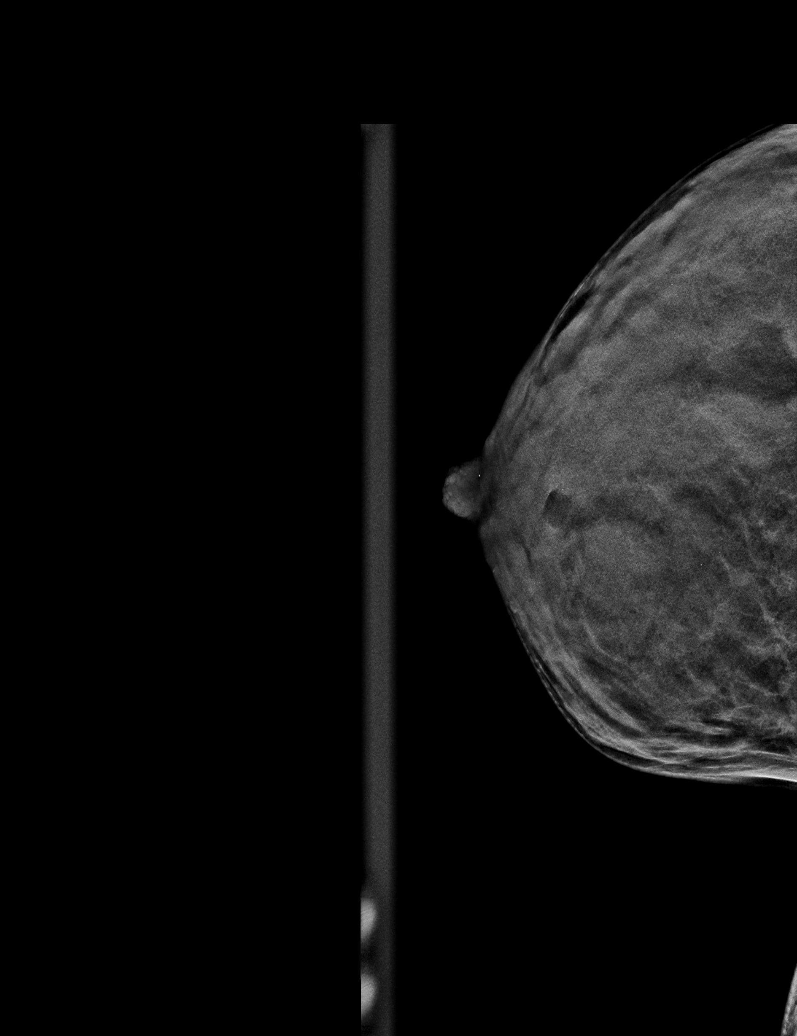

[L CC synth-2D (2 of 2)]
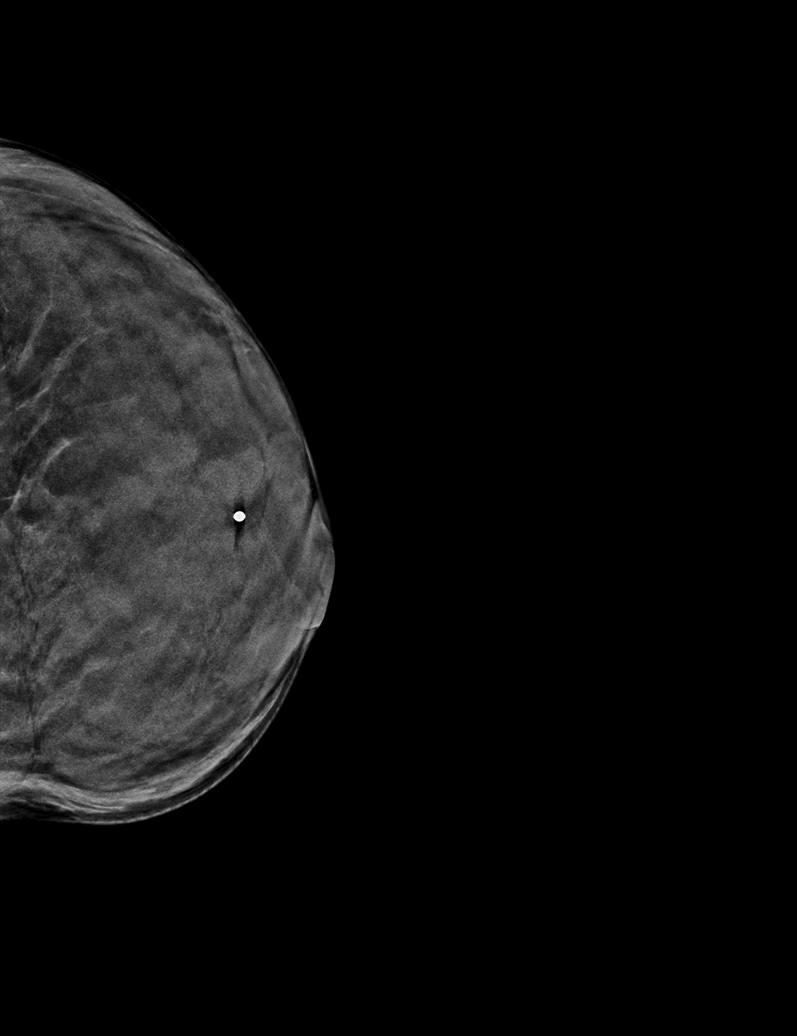

[L CC tomo · tomo slice 21/41.0]
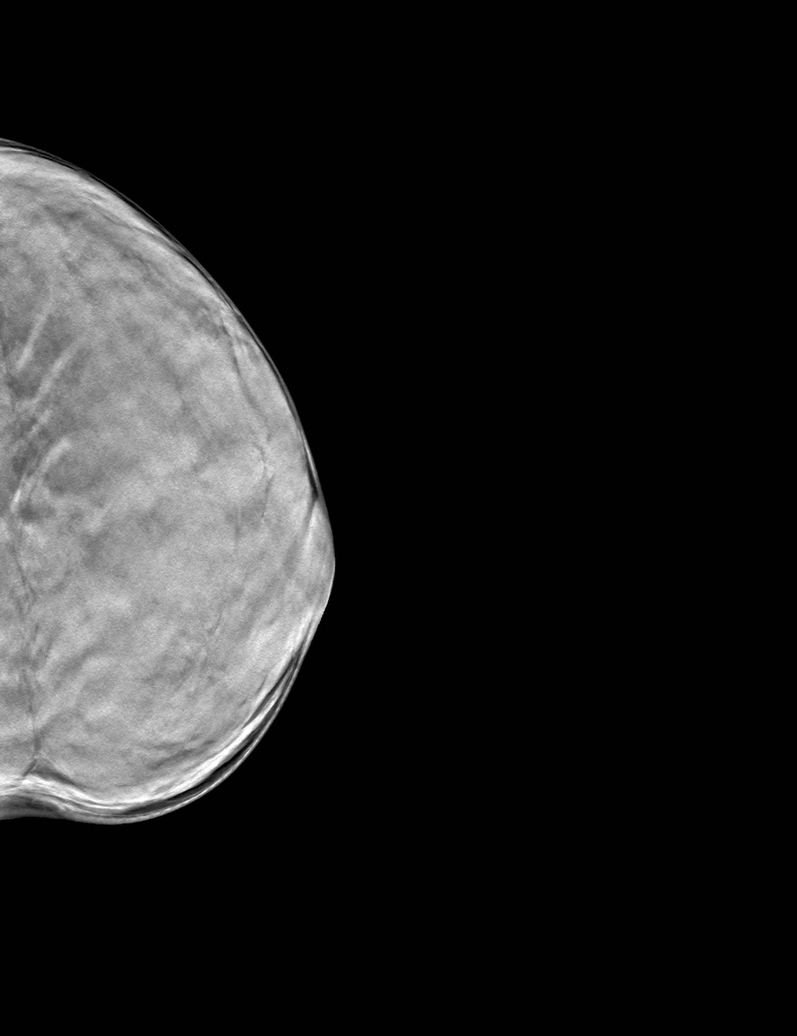

[6 of 30 positions shown; findings below may reference images not displayed]

ACR Breast Density Category d: The breast tissue is extremely dense,
which lowers the sensitivity of mammography.
FINDINGS: There is a partially obscured mass within the slightly outer LEFT
breast, measuring approximately 1 cm greatest dimension,
corresponding to the area of clinical concern with overlying skin
marker in place.

There are no dominant masses, suspicious calcifications or secondary
signs of malignancy elsewhere within either breast.

Targeted ultrasound is performed, showing an oval circumscribed
hypoechoic mass in the LEFT breast at the 3 o'clock axis, 1 cm from
the nipple, measuring 1.1 x 0.7 x 1.1 cm, at superficial depth,
corresponding to the palpable area of concern.

Additional targeted ultrasound is performed of the adjacent
subareolar and retroareolar LEFT breast, showing only normal
fibroglandular tissues and fat lobules. No solid or cystic mass. No
dilated ducts.
IMPRESSION: 1. Probably benign mass in the LEFT breast at the 3 o'clock axis, 1
cm from the nipple, measuring 1.1 x 0.7 x 1.1 cm, corresponding to
the palpable area of concern, most likely a benign fibroadenoma.
Recommend follow-up LEFT breast ultrasound in 6 months to ensure
stability.
2. No evidence of malignancy within the RIGHT breast.

RECOMMENDATION:
LEFT breast ultrasound in 6 months.

I have discussed the findings and recommendations with the patient.
If applicable, a reminder letter will be sent to the patient
regarding the next appointment.

BI-RADS CATEGORY  3: Probably benign.

## 2023-11-26 DIAGNOSIS — S61411A Laceration without foreign body of right hand, initial encounter: Secondary | ICD-10-CM | POA: Diagnosis not present

## 2023-11-26 DIAGNOSIS — W25XXXA Contact with sharp glass, initial encounter: Secondary | ICD-10-CM | POA: Diagnosis not present

## 2023-11-26 DIAGNOSIS — Z23 Encounter for immunization: Secondary | ICD-10-CM | POA: Diagnosis not present
